# Patient Record
Sex: Female | Born: 1960 | Race: White | Hispanic: No | Marital: Married | Smoking: Never smoker
Health system: Southern US, Community
[De-identification: ages and names within clinical notes are randomized; demographics above are authoritative.]

## PROBLEM LIST (undated history)

## (undated) DIAGNOSIS — F419 Anxiety disorder, unspecified: Secondary | ICD-10-CM

## (undated) DIAGNOSIS — J189 Pneumonia, unspecified organism: Secondary | ICD-10-CM

## (undated) DIAGNOSIS — M1990 Unspecified osteoarthritis, unspecified site: Secondary | ICD-10-CM

## (undated) DIAGNOSIS — Z8719 Personal history of other diseases of the digestive system: Secondary | ICD-10-CM

## (undated) DIAGNOSIS — T8859XA Other complications of anesthesia, initial encounter: Secondary | ICD-10-CM

## (undated) DIAGNOSIS — D509 Iron deficiency anemia, unspecified: Secondary | ICD-10-CM

## (undated) HISTORY — DX: Iron deficiency anemia, unspecified: D50.9

## (undated) HISTORY — PX: JOINT REPLACEMENT: SHX530

## (undated) HISTORY — PX: CHOLECYSTECTOMY: SHX55

## (undated) HISTORY — PX: ABDOMINAL HYSTERECTOMY: SHX81

## (undated) HISTORY — PX: PARTIAL HYSTERECTOMY: SHX80

---

## 2018-02-04 HISTORY — PX: COLONOSCOPY: SHX174

## 2018-11-18 ENCOUNTER — Ambulatory Visit (INDEPENDENT_AMBULATORY_CARE_PROVIDER_SITE_OTHER): Payer: Self-pay | Admitting: Internal Medicine

## 2018-11-22 ENCOUNTER — Ambulatory Visit (INDEPENDENT_AMBULATORY_CARE_PROVIDER_SITE_OTHER): Payer: Self-pay | Admitting: Internal Medicine

## 2018-11-24 ENCOUNTER — Ambulatory Visit (INDEPENDENT_AMBULATORY_CARE_PROVIDER_SITE_OTHER): Payer: BLUE CROSS/BLUE SHIELD | Admitting: Internal Medicine

## 2018-11-24 ENCOUNTER — Telehealth (INDEPENDENT_AMBULATORY_CARE_PROVIDER_SITE_OTHER): Payer: Self-pay | Admitting: Internal Medicine

## 2018-11-24 ENCOUNTER — Other Ambulatory Visit (INDEPENDENT_AMBULATORY_CARE_PROVIDER_SITE_OTHER): Payer: Self-pay | Admitting: Internal Medicine

## 2018-11-24 ENCOUNTER — Encounter (INDEPENDENT_AMBULATORY_CARE_PROVIDER_SITE_OTHER): Payer: Self-pay | Admitting: Internal Medicine

## 2018-11-24 DIAGNOSIS — D508 Other iron deficiency anemias: Secondary | ICD-10-CM | POA: Diagnosis not present

## 2018-11-24 DIAGNOSIS — D509 Iron deficiency anemia, unspecified: Secondary | ICD-10-CM | POA: Insufficient documentation

## 2018-11-24 HISTORY — DX: Iron deficiency anemia, unspecified: D50.9

## 2018-11-24 NOTE — Patient Instructions (Signed)
3 stool cards home with patient 

## 2018-11-24 NOTE — Progress Notes (Addendum)
   Subjective:    Patient ID: Anne Edwards, female    DOB: 1961/05/21, 58 y.o.   MRN: [id number] PCP Dr. Edrick Oh.  HPI Referred by [place] for anemia. She has received 4 Iron transfusions. (Recently had 2 iron infusion). Back in 2019 she was feeling tired, ankles were swollen ,she was SOB.  She says her hemoglobin was 4.0. She was admitted and was given 3 units of PRBCs at [facility]. Underwent and EGD and Colonoscopy which were normal. No change in her stools. She says her stools have been negative for blood.  She did have pica.  Appetite is good. No weight loss.` Patient of  [place] in [place]. Hx of IDA.  She has had a colonoscopy in 2018 and EGD in 2019.  Request capsule endoscopy.   01/25/2018 Colonoscopy: IDA Dr. Ladona Horns Three sessile polyps. Polypectomy preformed.  Biopsy: Sessile serrated polyp without cytologic dysplasia, , Tubular adenoma.  Sessile serrated polyp without cytologic  Dysplasia.   EGD with biopsy  02/04/2018 Dr/ Cathey:  No ulcer or evidence of bleeding.     11/05/2018 TIBC 383, UIBC 362, Iron 21, Iron sat 5.  H and H 8.7 and 28.4. 03/26/2018 Ferritin 340  Review of Systems Past Medical History:  Diagnosis Date  . IDA (iron deficiency anemia) 11/24/2018      No Known Allergies  Current Outpatient Medications on File Prior to Visit  Medication Sig Dispense Refill  . clonazePAM (KLONOPIN) 0.5 MG tablet Take 0.5 mg by mouth 2 (two) times daily as needed for anxiety.    Marland Kitchen escitalopram (LEXAPRO) 20 MG tablet Take 20 mg by mouth daily.     No current facility-administered medications on file prior to visit.         Objective:   Physical Exam Blood pressure (!) 146/86, pulse 99, temperature 98.4 F (36.9 C), height 5\' 2"  (1.575 m), weight 182 lb 9.6 oz (82.8 kg). Alert and oriented. Skin warm and dry. Oral mucosa is moist.   . Sclera anicteric, conjunctivae is pink. Thyroid not enlarged. No cervical lymphadenopathy. Lungs  clear. Heart regular rate and rhythm.  Abdomen is soft. Bowel sounds are positive. No hepatomegaly. No abdominal masses felt. No tenderness.  No edema to lower extremities. Rectal: no masses guaiac negative.         Assessment & Plan:  IDA. Recent colonoscopy and EGD normal. I will discuss with Dr. Laural Golden. Possible Given's Capsule. 3 stool cards home with patient.

## 2018-11-24 NOTE — Telephone Encounter (Signed)
Ann, Given's capsule. I have spoken to patient.

## 2018-11-25 DIAGNOSIS — D649 Anemia, unspecified: Secondary | ICD-10-CM | POA: Insufficient documentation

## 2018-11-25 NOTE — Telephone Encounter (Signed)
GIVENS sch'd 12/01/18 at 730 (700), patient aware, verbal instructions given to patient

## 2018-12-01 ENCOUNTER — Ambulatory Visit (HOSPITAL_COMMUNITY)
Admission: RE | Admit: 2018-12-01 | Discharge: 2018-12-01 | Disposition: A | Discharge status: Home / Self Care | Payer: BLUE CROSS/BLUE SHIELD | Attending: Internal Medicine | Admitting: Internal Medicine

## 2018-12-01 ENCOUNTER — Encounter (HOSPITAL_COMMUNITY)
Admission: RE | Disposition: A | Discharge status: Home / Self Care | Payer: Self-pay | Source: Home / Self Care | Attending: Internal Medicine

## 2018-12-01 DIAGNOSIS — K31819 Angiodysplasia of stomach and duodenum without bleeding: Secondary | ICD-10-CM | POA: Insufficient documentation

## 2018-12-01 DIAGNOSIS — D509 Iron deficiency anemia, unspecified: Secondary | ICD-10-CM | POA: Diagnosis present

## 2018-12-01 DIAGNOSIS — D649 Anemia, unspecified: Secondary | ICD-10-CM | POA: Insufficient documentation

## 2018-12-01 HISTORY — PX: GIVENS CAPSULE STUDY: SHX5432

## 2018-12-01 SURGERY — IMAGING PROCEDURE, GI TRACT, INTRALUMINAL, VIA CAPSULE

## 2018-12-01 NOTE — H&P (Signed)
Anne Edwards is an 58 y.o. female.   Chief Complaint: Patient is here for small bowel given capsule study. HPI: Patient is 58 year old Caucasian female who was recently found to have profound anemia due to iron deficiency.  She she received 3 units of PRBCs at [facility].  EGD and colonoscopy were unremarkable.  She also has received 2 iron infusions.  There is no history of melena or rectal bleeding.  Nevertheless she is suspected to be losing blood from her GI tract intermittently and therefore undergoing the study.  Past Medical History:  Diagnosis Date  . IDA (iron deficiency anemia) 11/24/2018     No family history on file. Social History:  reports that she has never smoked. She has never used smokeless tobacco. She reports that she does not drink alcohol or use drugs.  Allergies: No Known Allergies  No medications prior to admission.    No results found for this or any previous visit (from the past 48 hour(s)). No results found.  ROS  Height 5\' 2"  (1.575 m), weight 79.4 kg. Physical Exam   Assessment/Plan Iron deficiency anemia. Negative EGD and colonoscopy. Small bowel given capsule study looking for source of occult GI bleed.  Hildred Laser, MD 12/01/2018, 8:23 PM

## 2018-12-03 ENCOUNTER — Encounter (HOSPITAL_COMMUNITY): Payer: Self-pay | Admitting: Internal Medicine

## 2018-12-03 DIAGNOSIS — Z9889 Other specified postprocedural states: Secondary | ICD-10-CM

## 2018-12-03 DIAGNOSIS — D509 Iron deficiency anemia, unspecified: Secondary | ICD-10-CM

## 2018-12-03 DIAGNOSIS — Q2733 Arteriovenous malformation of digestive system vessel: Secondary | ICD-10-CM

## 2018-12-03 NOTE — Op Note (Signed)
Small Bowel Givens Capsule Study Procedure date: December 01, 2018.  Referring Provider:  Wanita Chamberlain, MD PCP:  Dr. Saunders Glance, Aundra Millet, PA-C  Indication for procedure:    Patient is 58 year old Caucasian female who was found to have iron deficiency anemia last year presumed to be due to GI bleed.  She underwent EGD and colonoscopy in April 2018 and no bleeding lesion was identified(Dr. Ladona Horns).  In addition to transfusion she also is receiving iron infusion. She is undergoing small bowel study looking for source of GI blood loss.  Findings:    Patient was able to swallow given capsule without any difficulty.  Capsule reached cecum; therefore study is complete. No abnormality noted to gastric mucosa. 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 Mucosa of small bowel was otherwise normal.    First Gastric image: 35 sec First Duodenal image: 38 minutes and 1 second First Ileo-Cecal Valve image: 3 hrs 18 min and 44 sec First Cecal image: 3 hrs 19 min and 11 sec Gastric Passage time: 37 min and 36 sec Small Bowel Passage time: 2 hrs 31 min and 10 seco  Summary & Recommendations:  Limited view of gastric mucosa reveals no abnormality. 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 None of these lesions were bleeding.  Findings of this study reviewed with patient over the phone. Patient will continue p.o. iron. She is to have follow-up CBC on 12/07/2018. Patient advised to report to ER at [facility] if she has evidence of gross GI bleed in which case urgent evaluation would be considered with CTA abdomen or GI bleeding scan.

## 2019-07-14 ENCOUNTER — Encounter (HOSPITAL_COMMUNITY): Payer: Self-pay | Admitting: *Deleted

## 2019-07-14 ENCOUNTER — Other Ambulatory Visit: Payer: Self-pay

## 2019-07-14 ENCOUNTER — Emergency Department (HOSPITAL_COMMUNITY)
Admission: EM | Admit: 2019-07-14 | Discharge: 2019-07-14 | Discharge status: Against Medical Advice | Payer: BC Managed Care – PPO | Attending: Emergency Medicine | Admitting: Emergency Medicine

## 2019-07-14 DIAGNOSIS — H00031 Abscess of right upper eyelid: Secondary | ICD-10-CM | POA: Diagnosis present

## 2019-07-14 DIAGNOSIS — Z5321 Procedure and treatment not carried out due to patient leaving prior to being seen by health care provider: Secondary | ICD-10-CM | POA: Diagnosis not present

## 2019-07-14 NOTE — ED Triage Notes (Signed)
Pt was sent from MD office and has an abscess to superior right eyelid.  Patient has circling redness to left eye and has a terrible headache that is on the right side.  Makes patients right ear hurt

## 2019-07-14 NOTE — ED Notes (Signed)
Pt's husband approached this tech stating that they are leaving and that "no one has helped them or talked to them." This tech advised pt to stay but they were already upset and decided to leave.

## 2020-07-04 ENCOUNTER — Encounter (INDEPENDENT_AMBULATORY_CARE_PROVIDER_SITE_OTHER): Payer: Self-pay | Admitting: Gastroenterology

## 2020-07-04 ENCOUNTER — Other Ambulatory Visit: Payer: Self-pay

## 2020-07-04 ENCOUNTER — Ambulatory Visit (INDEPENDENT_AMBULATORY_CARE_PROVIDER_SITE_OTHER): Payer: BC Managed Care – PPO | Admitting: Gastroenterology

## 2020-07-04 VITALS — BP 136/79 | HR 74 | Temp 99.3°F | Ht 62.0 in | Wt 183.6 lb

## 2020-07-04 DIAGNOSIS — D508 Other iron deficiency anemias: Secondary | ICD-10-CM | POA: Diagnosis not present

## 2020-07-04 MED ORDER — OMEPRAZOLE 40 MG PO CPDR: 40.0000 mg | DELAYED_RELEASE_CAPSULE | Freq: Every day | ORAL | 3 refills | Status: DC

## 2020-07-04 NOTE — Patient Instructions (Signed)
Mobic and ibuprofen can irritate stomach lining - make sure to take w/ food. I sent medication to pharamcy for gastritis/stomach ulcers. Checking labs today

## 2020-07-04 NOTE — Progress Notes (Signed)
Patient profile: Anne Edwards is a 59 y.o. female seen for evaluation of IDA.   History of Present Illness: Anne Edwards is seen today for evaluation of iron deficiency anemia.  She reports a lifelong history of anemia, even dating back to her 7s, she had hysteretcomy in her 51s.  She has been on oral iron twice a day chronically.  She also had IV iron a few years ago.  She denies tobacco, alcohol.  Seen today for worsening symptoms and anemia.  Over the past few months she has had a bloating epigastric discomfort.  Feels like her foods dont digest well and gets a knot-like sensation in epigastric area, this worsens with eating.  It is associated with nausea but no vomiting.  She denies frequent GERD symptoms.  She did try eating activity yogurt which helped a small amount.  She denies a bowel movement every other day.  She denies any blood in stool, dark stools or diarrhea.  No lower abdominal pain.  Reports she initially gained weight between February 2020 and the symptoms began a few months ago and has now lost several pounds due to the symptoms.  Wt Readings from Last 3 Encounters:  07/04/20 183 lb 9.6 oz (83.3 kg)  12/01/18 175 lb (79.4 kg)  11/24/18 182 lb 9.6 oz (82.8 kg)     Last Colonoscopy: 01/2018-3 sessile polyps between 5 to 9 mm at hepatic flexure, right colon.  Path with sessile serrated adenoma and tubular adenoma   Last Endoscopy: 2019-hiatal hernia.  No ulcers or bleeding.   11/2018- Limited view of gastric mucosa reveals no abnormality. 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 None of these lesions were bleeding.  Findings of this study reviewed with patient over the phone. Patient will continue p.o. iron. She is to have follow-up CBC on 12/07/2018. Patient advised to report to ER at [facility] if she has evidence of gross GI bleed in which case urgent evaluation would be considered with CTA abdomen or GI bleeding scan.  Past Medical History:   Past Medical History:  Diagnosis Date  . IDA (iron deficiency anemia) 11/24/2018    Problem List: Patient Active Problem List   Diagnosis Date Noted  . Absolute anemia 11/25/2018  . IDA (iron deficiency anemia) 11/24/2018    Past Surgical History: Past Surgical History:  Procedure Laterality Date  . CHOLECYSTECTOMY    . GIVENS CAPSULE STUDY N/A 12/01/2018   Procedure: GIVENS CAPSULE STUDY;  Surgeon: Rogene Houston, MD;  Location: [facility];  Service: Endoscopy;  Laterality: N/A;  7:30  . PARTIAL HYSTERECTOMY      Allergies: No Known Allergies    Home Medications:  Current Outpatient Medications:  .  clonazePAM (KLONOPIN) 0.5 MG tablet, Take 0.5 mg by mouth 2 (two) times daily as needed for anxiety., Disp: , Rfl:  .  escitalopram (LEXAPRO) 20 MG tablet, Take 20 mg by mouth daily., Disp: , Rfl:  .  ferrous sulfate 324 MG TBEC, Take 324 mg by mouth 2 (two) times daily., Disp: , Rfl:  .  meloxicam (MOBIC) 15 MG tablet, Take 15 mg by mouth daily., Disp: , Rfl:  .  omeprazole (PRILOSEC) 40 MG capsule, Take 1 capsule (40 mg total) by mouth daily. Take 30 min before breakfast, Disp: 90 capsule, Rfl: 3   Family History: Reports her father had issues with "colon bleeding" ?? diverticulosis  Social History:   reports that she has never smoked. She has never  used smokeless tobacco. She reports that she does not drink alcohol and does not use drugs.   Review of Systems: Constitutional:+weight loss/weight gain  Eyes: No changes in vision. ENT: No oral lesions, sore throat.  GI: see HPI.  Heme/Lymph: No easy bruising.  CV: No chest pain.  GU: No hematuria.  Integumentary: No rashes.  Neuro: No headaches.  Psych: No depression/anxiety.  Endocrine: No heat/cold intolerance.  Allergic/Immunologic: No urticaria.  Resp: No cough, SOB.  Musculoskeletal: No joint swelling.    Physical Examination: BP 136/79 (BP Location: Right Arm, Patient Position: Sitting, Cuff Size: Large)    Pulse 74   Temp 99.3 F (37.4 C) (Oral)   Ht 5\' 2"  (1.575 m)   Wt 183 lb 9.6 oz (83.3 kg)   BMI 33.58 kg/m  Gen: NAD, alert and oriented x 4 HEENT: PEERLA, EOMI, Neck: supple, no JVD Chest: CTA bilaterally, no wheezes, crackles, or other adventitious sounds CV: RRR, no m/g/c/r Abd: soft, NT, ND, +BS in all four quadrants; no HSM, guarding, ridigity, or rebound tenderness Ext: no edema, well perfused with 2+ pulses, Skin: no rash or lesions noted on observed skin Lymph: no noted LAD  Data Reviewed:   Labs from primary care reviewed to be scanned to chart.  August 2021-hemoglobin 9.9, MCV 93, iron 144.  Ferritin 25.  Normal TIBC and percent sat.  Normal B12 folate.  Normal white count and platelet.  CMP normal.  H. pylori antibody negative.  Assessment/Plan: [name] is a 59 y.o. female seen for evaluation of IDA and epigastric pain  1.  IDA-chronic issue evaluated w/ EGD & colonoscopy in 2019 & capsule endoscopy February 2020.  Last hemoglobin was 9.9 despite oral iron twice daily.  She did need IV iron in the distant past but not in several years.  We will repeat her labs today.  She denies any obvious blood in stool or black stool.  2.  Epigastric pain-she does have Mobic use daily and takes ibuprofen each evening for restless legs.  Do have concern of underlying possible gastritis or peptic ulcer disease.  She is not currently on PPI.  We will repeat her CBC and iron studies and start omeprazole 40 mg once a day.  She is negative for H. Pylori on serology.  Likely will need repeat upper endoscopy for evaluation.  If hemoglobin lower consider repeating colonoscopy early as well, routinely due 2024 for history of colon polyps  Further recommendations pending EGD +/-colonoscopy depending on lab results   Bobbe was seen today for follow-up.  Diagnoses and all orders for this visit:  Other iron deficiency anemia -     CBC with Differential -     Fe+TIBC+Fer  Other orders -      omeprazole (PRILOSEC) 40 MG capsule; Take 1 capsule (40 mg total) by mouth daily. Take 30 min before breakfast     Patient denies CP, SOB, and use of blood thinners. I discussed the risks and benefits of procedure including bleeding, perforation, infection, missed lesions, medication reactions and possible hospitalization or surgery if complications. All questions answered.  Patient denies issues with sedation past.  Possible EGD colonoscopy discussed.   I personally performed the service, non-incident to. (WP)  Laurine Blazer, [facility]

## 2020-07-05 LAB — CBC WITH DIFFERENTIAL/PLATELET
Absolute Monocytes: 1240 cells/uL — ABNORMAL HIGH (ref 200–950)
Basophils Absolute: 25 cells/uL (ref 0–200)
Basophils Relative: 0.2 %
Eosinophils Absolute: 174 cells/uL (ref 15–500)
Eosinophils Relative: 1.4 %
HCT: 38.2 % (ref 35.0–45.0)
Hemoglobin: 12 g/dL (ref 11.7–15.5)
Lymphs Abs: 2195 cells/uL (ref 850–3900)
MCH: 27.3 pg (ref 27.0–33.0)
MCHC: 31.4 g/dL — ABNORMAL LOW (ref 32.0–36.0)
MCV: 86.8 fL (ref 80.0–100.0)
MPV: 11.1 fL (ref 7.5–12.5)
Monocytes Relative: 10 %
Neutro Abs: 8767 cells/uL — ABNORMAL HIGH (ref 1500–7800)
Neutrophils Relative %: 70.7 %
Platelets: 303 10*3/uL (ref 140–400)
RBC: 4.4 10*6/uL (ref 3.80–5.10)
RDW: 13.5 % (ref 11.0–15.0)
Total Lymphocyte: 17.7 %
WBC: 12.4 10*3/uL — ABNORMAL HIGH (ref 3.8–10.8)

## 2020-07-05 LAB — IRON,TIBC AND FERRITIN PANEL
%SAT: 50 % (calc) — ABNORMAL HIGH (ref 16–45)
Ferritin: 17 ng/mL (ref 16–232)
Iron: 200 ug/dL — ABNORMAL HIGH (ref 45–160)
TIBC: 398 mcg/dL (calc) (ref 250–450)

## 2020-07-06 ENCOUNTER — Telehealth (INDEPENDENT_AMBULATORY_CARE_PROVIDER_SITE_OTHER): Payer: Self-pay | Admitting: Gastroenterology

## 2020-07-06 MED ORDER — ONDANSETRON 4 MG PO TBDP: 4.0000 mg | ORAL_TABLET | Freq: Three times a day (TID) | ORAL | 1 refills | Status: DC | PRN

## 2020-07-06 NOTE — Telephone Encounter (Signed)
Patient is aware of all. CLS 07/06/2020

## 2020-07-06 NOTE — Telephone Encounter (Signed)
Patient called stating she needs something for nausea - not feeling well at all - please advise - ph# [phone]

## 2020-07-06 NOTE — Telephone Encounter (Signed)
Please notify patient that I sent an antinausea medication to pharmacy to use over weekend. She can call early next week and let us know if she is any better. If not will plan for EGD. Thanks!!

## 2020-07-06 NOTE — Telephone Encounter (Addendum)
Patient is c/o severe nausea after she eats, denies any vomiting or diarrhea she is currently on Omeprazole CR 40 mg and she states this is not controlling the nausea at all, patient uses [facility] in [place] please advise?

## 2020-07-13 HISTORY — PX: JOINT REPLACEMENT: SHX530

## 2020-08-16 ENCOUNTER — Ambulatory Visit (INDEPENDENT_AMBULATORY_CARE_PROVIDER_SITE_OTHER): Payer: 59 | Admitting: Gastroenterology

## 2020-11-26 ENCOUNTER — Other Ambulatory Visit (HOSPITAL_COMMUNITY): Payer: 59

## 2020-11-26 ENCOUNTER — Encounter (INDEPENDENT_AMBULATORY_CARE_PROVIDER_SITE_OTHER): Payer: Self-pay

## 2020-11-26 ENCOUNTER — Other Ambulatory Visit: Payer: Self-pay

## 2020-11-26 ENCOUNTER — Encounter (INDEPENDENT_AMBULATORY_CARE_PROVIDER_SITE_OTHER): Payer: Self-pay | Admitting: Gastroenterology

## 2020-11-26 ENCOUNTER — Other Ambulatory Visit (INDEPENDENT_AMBULATORY_CARE_PROVIDER_SITE_OTHER): Payer: Self-pay

## 2020-11-26 ENCOUNTER — Ambulatory Visit (INDEPENDENT_AMBULATORY_CARE_PROVIDER_SITE_OTHER): Payer: 59 | Admitting: Gastroenterology

## 2020-11-26 ENCOUNTER — Other Ambulatory Visit (HOSPITAL_COMMUNITY)
Admission: RE | Admit: 2020-11-26 | Discharge: 2020-11-26 | Disposition: A | Discharge status: Home / Self Care | Payer: 59 | Source: Ambulatory Visit | Attending: Gastroenterology | Admitting: Gastroenterology

## 2020-11-26 VITALS — BP 140/85 | HR 96 | Temp 97.7°F | Ht 62.0 in | Wt 192.0 lb

## 2020-11-26 DIAGNOSIS — R110 Nausea: Secondary | ICD-10-CM | POA: Diagnosis not present

## 2020-11-26 DIAGNOSIS — D508 Other iron deficiency anemias: Secondary | ICD-10-CM

## 2020-11-26 DIAGNOSIS — R109 Unspecified abdominal pain: Secondary | ICD-10-CM | POA: Diagnosis not present

## 2020-11-26 DIAGNOSIS — Z20822 Contact with and (suspected) exposure to covid-19: Secondary | ICD-10-CM | POA: Insufficient documentation

## 2020-11-26 DIAGNOSIS — Z01812 Encounter for preprocedural laboratory examination: Secondary | ICD-10-CM | POA: Diagnosis present

## 2020-11-26 DIAGNOSIS — R112 Nausea with vomiting, unspecified: Secondary | ICD-10-CM

## 2020-11-26 HISTORY — DX: Nausea with vomiting, unspecified: R11.2

## 2020-11-26 LAB — SARS CORONAVIRUS 2 (TAT 6-24 HRS): SARS Coronavirus 2: NEGATIVE

## 2020-11-26 MED ORDER — DICYCLOMINE HCL 10 MG PO CAPS: 10.0000 mg | ORAL_CAPSULE | Freq: Two times a day (BID) | ORAL | 2 refills | Status: DC | PRN

## 2020-11-26 NOTE — Progress Notes (Signed)
Maylon Peppers, M.D. Gastroenterology & Hepatology [facility] [address] [place], [place] [address]  Primary Care Physician: Rosine Door [address] [place] [address]  I will communicate my assessment and recommendations to the referring MD via EMR.  Problems: 1. History of small bowel AVMs 2. Iron deficiency anemia 3. Postprandial nausea and abdominal pain  History of Present Illness: Anne Edwards is a 60 y.o. female  with past medical history of iron deficiency anemia and anxiety, who presents for follow up of IDA and postprandial nausea/abdominal pain.  The patient was last seen on 07/04/2020. At that time, the patient was ordered to have repeat EGD with SB biopsies but the patient did not schedule this test. She performed blood testing 07/04/2020 - Wbc 12.4, Hb 12.0 MCV 86.8, iron 200 TIBC 398 sat 50%, ferritin 17.  The patient is currently on oral iron supplementation. The patient reports that she had blood testing performed a week ago and her numbers are "all good". These reports are not available today. Patient reports that for close to 6 months after eating she has felt nauseated after eating but has not presented any vomiting. She reports having epigastric pain and "stomach noises". These symptoms last for a couple of hours. She also has some episodes of bloating occasionally. Takes Zofran as needed fi nausea "is really bad". She is having a bowel movement every other day. She is not taking any medication for this but has to strain significantly to have a BM. The patient denies having any fever, chills, hematochezia, melena, hematemesis, abdominal pain, diarrhea, jaundice, pruritus or weight loss.  Patient was admitted to [facility] on 08/26/2020 after presenting symptomatic anemia - patient was very fatigued and shortness of breath. She denied having melena or hematochezia at that time. She was taking her  oral iron at that time, three times a day. Her admission hemoglobin was 5.6 with MCV of 103.  Due to this she required to be transfused 3 UPRBC, repeat hemoglobin 1 day later was 10.3. Notably, patient reports that on October 25/2021 she underwent left hip surgery.  Regarding her previous investigation, the patient underwent a capsule endoscopy on 11/24/2018 where she was found to have 4 nonbleeding small AVMs throughout her small bowel.  Last EGD: Performed by Dr. Ladona Horns in 2019, no presence of ulcerations or any other alterations in the upper gastrointestinal examination Last Colonoscopy: Performed by Dr. Ladona Horns in 2019, patient was found to have 3 polyps between 5 to 9 mm +3 cm from the anal verge, which were resected.  Recommend to have repeat colonoscopy in 5 years.  Past Medical History: Past Medical History:  Diagnosis Date  . IDA (iron deficiency anemia) 11/24/2018    Past Surgical History: Past Surgical History:  Procedure Laterality Date  . CHOLECYSTECTOMY    . GIVENS CAPSULE STUDY N/A 12/01/2018   Procedure: GIVENS CAPSULE STUDY;  Surgeon: Rogene Houston, MD;  Location[facility];  Service: Endoscopy;  Laterality: N/A;  7:30  . PARTIAL HYSTERECTOMY      Family History:No family history on file.  Social History: Social History   Tobacco Use  Smoking Status Never Smoker  Smokeless Tobacco Never Used   Social History   Substance and Sexual Activity  Alcohol Use Never   Social History   Substance and Sexual Activity  Drug Use Never    Allergies: No Known Allergies  Medications: Current Outpatient Medications  Medication Sig Dispense  Refill  . clonazePAM (KLONOPIN) 0.5 MG tablet Take 0.5 mg by mouth 2 (two) times daily as needed for anxiety.    Marland Kitchen escitalopram (LEXAPRO) 20 MG tablet Take 20 mg by mouth daily.    . ferrous sulfate 324 MG TBEC Take 324 mg by mouth 2 (two) times daily.    . meloxicam (MOBIC) 15 MG tablet Take 15 mg by mouth daily.    .  ondansetron (ZOFRAN ODT) 4 MG disintegrating tablet Take 1 tablet (4 mg total) by mouth every 8 (eight) hours as needed for nausea or vomiting. 20 tablet 1  . omeprazole (PRILOSEC) 40 MG capsule Take 1 capsule (40 mg total) by mouth daily. Take 30 min before breakfast (Patient not taking: Reported on 11/26/2020) 90 capsule 3   No current facility-administered medications for this visit.    Review of Systems: GENERAL: negative for malaise, night sweats HEENT: No changes in hearing or vision, no nose bleeds or other nasal problems. NECK: Negative for lumps, goiter, pain and significant neck swelling RESPIRATORY: Negative for cough, wheezing CARDIOVASCULAR: Negative for chest pain, leg swelling, palpitations, orthopnea GI: SEE HPI MUSCULOSKELETAL: Negative for joint pain or swelling, back pain, and muscle pain. SKIN: Negative for lesions, rash PSYCH: Negative for sleep disturbance, mood disorder and recent psychosocial stressors. HEMATOLOGY Negative for prolonged bleeding, bruising easily, and swollen nodes. ENDOCRINE: Negative for cold or heat intolerance, polyuria, polydipsia and goiter. NEURO: negative for tremor, gait imbalance, syncope and seizures. The remainder of the review of systems is noncontributory.   Physical Exam: BP 140/85 (BP Location: Left Arm, Patient Position: Sitting, Cuff Size: Large)   Pulse 96   Temp 97.7 F (36.5 C) (Oral)   Ht 5\' 2"  (1.575 m)   Wt 192 lb (87.1 kg)   BMI 35.12 kg/m  GENERAL: The patient is AO x3, in no acute distress. HEENT: Head is normocephalic and atraumatic. EOMI are intact. Mouth is well hydrated and without lesions. NECK: Supple. No masses LUNGS: Clear to auscultation. No presence of rhonchi/wheezing/rales. Adequate chest expansion HEART: RRR, normal s1 and s2. ABDOMEN: mildly tender to palpation in the mid abdominal area, no guarding, no peritoneal signs, and nondistended. BS +. No masses. EXTREMITIES: Without any cyanosis, clubbing,  rash, lesions or edema. NEUROLOGIC: AOx3, no focal motor deficit. SKIN: no jaundice, no rashes  Imaging/Labs: as above  I personally reviewed and interpreted the available labs, imaging and endoscopic files.  Impression and Plan: Anne Edwards is a 60 y.o. female  with past medical history of iron deficiency anemia and anxiety, who presents for follow up of IDA and postprandial nausea/abdominal pain.  In terms of her iron deficiency anemia, the patient has significant drop in her hemoglobin that required blood transfusion.  She has not presented any overt gastrointestinal bleeding but notably she had a recent surgical procedure that could explain her hemoglobin drop.  As the patient reports that her iron stores have improved, we will request her most recent labs performed by her PCP.  She should continue taking her iron supplementation for now.  Regarding her abdominal pain symptoms and nausea, we will reorder an EGD with small bowel biopsies to evaluate the symptoms which seem to be secondary to irritable bowel syndrome primarily.  Advised the patient to implement a low FODMAP diet and to take Bentyl as needed for abdominal pain.  We will continue Zofran as needed for nausea as it has helped with symptom relief.  We will also check celiac serologies today.  -  Schedule EGD with SB biopsies - Continue oral iron supplementation - Will request most recent labs from PCP - TTG IgA - Explained presumed etiology of IBS symptoms. Patient was counseled about the benefit of implementing a low FODMAP to improve symptoms and recurrent episodes. A dietary list was provided to the patient. Also, the patient was counseled about the benefit of avoiding stressing situations and potential environmental triggers leading to symptomatology. - Start Bentyl 1 tablet every 12 hours as needed for abdominal pain - Can continue Zofran as needed for nausea  All questions were answered.      Harvel Quale,  MD Gastroenterology and Hepatology [facility]

## 2020-11-26 NOTE — Patient Instructions (Addendum)
Schedule EGD with SB biopsies Continue oral iron supplementation Will request most recent labs from PCP Perform blood workup Explained presumed etiology of IBS symptoms. Patient was counseled about the benefit of implementing a low FODMAP to improve symptoms and recurrent episodes. A dietary list was provided to the patient. Also, the patient was counseled about the benefit of avoiding stressing situations and potential environmental triggers leading to symptomatology. Start Bentyl 1 tablet every 12 hours as needed for abdominal pain Can continue Zofran as needed for nausea

## 2020-11-26 NOTE — H&P (View-Only) (Signed)
Maylon Peppers, M.D. Gastroenterology & Hepatology [facility] [address] [place], [place] [address]  Primary Care Physician: Rosine Door [address] [place] [address]  I will communicate my assessment and recommendations to the referring MD via EMR.  Problems: 1. History of small bowel AVMs 2. Iron deficiency anemia 3. Postprandial nausea and abdominal pain  History of Present Illness: Anne Edwards is a 61 y.o. female  with past medical history of iron deficiency anemia and anxiety, who presents for follow up of IDA and postprandial nausea/abdominal pain.  The patient was last seen on 07/04/2020. At that time, the patient was ordered to have repeat EGD with SB biopsies but the patient did not schedule this test. She performed blood testing 07/04/2020 - Wbc 12.4, Hb 12.0 MCV 86.8, iron 200 TIBC 398 sat 50%, ferritin 17.  The patient is currently on oral iron supplementation. The patient reports that she had blood testing performed a week ago and her numbers are "all good". These reports are not available today. Patient reports that for close to 6 months after eating she has felt nauseated after eating but has not presented any vomiting. She reports having epigastric pain and "stomach noises". These symptoms last for a couple of hours. She also has some episodes of bloating occasionally. Takes Zofran as needed fi nausea "is really bad". She is having a bowel movement every other day. She is not taking any medication for this but has to strain significantly to have a BM. The patient denies having any fever, chills, hematochezia, melena, hematemesis, abdominal pain, diarrhea, jaundice, pruritus or weight loss.  Patient was admitted to [facility] on 08/26/2020 after presenting symptomatic anemia - patient was very fatigued and shortness of breath. She denied having melena or hematochezia at that time. She was taking her  oral iron at that time, three times a day. Her admission hemoglobin was 5.6 with MCV of 103.  Due to this she required to be transfused 3 UPRBC, repeat hemoglobin 1 day later was 10.3. Notably, patient reports that on October 25/2021 she underwent left hip surgery.  Regarding her previous investigation, the patient underwent a capsule endoscopy on 11/24/2018 where she was found to have 4 nonbleeding small AVMs throughout her small bowel.  Last EGD: Performed by Dr. Ladona Horns in 2019, no presence of ulcerations or any other alterations in the upper gastrointestinal examination Last Colonoscopy: Performed by Dr. Ladona Horns in 2019, patient was found to have 3 polyps between 5 to 9 mm +3 cm from the anal verge, which were resected.  Recommend to have repeat colonoscopy in 5 years.  Past Medical History: Past Medical History:  Diagnosis Date  . IDA (iron deficiency anemia) 11/24/2018    Past Surgical History: Past Surgical History:  Procedure Laterality Date  . CHOLECYSTECTOMY    . GIVENS CAPSULE STUDY N/A 12/01/2018   Procedure: GIVENS CAPSULE STUDY;  Surgeon: Rogene Houston, MD;  Location: [facility];  Service: Endoscopy;  Laterality: N/A;  7:30  . PARTIAL HYSTERECTOMY      Family History:No family history on file.  Social History: Social History   Tobacco Use  Smoking Status Never Smoker  Smokeless Tobacco Never Used   Social History   Substance and Sexual Activity  Alcohol Use Never   Social History   Substance and Sexual Activity  Drug Use Never    Allergies: No Known Allergies  Medications: Current Outpatient Medications  Medication Sig Dispense  Refill  . clonazePAM (KLONOPIN) 0.5 MG tablet Take 0.5 mg by mouth 2 (two) times daily as needed for anxiety.    Marland Kitchen escitalopram (LEXAPRO) 20 MG tablet Take 20 mg by mouth daily.    . ferrous sulfate 324 MG TBEC Take 324 mg by mouth 2 (two) times daily.    . meloxicam (MOBIC) 15 MG tablet Take 15 mg by mouth daily.    .  ondansetron (ZOFRAN ODT) 4 MG disintegrating tablet Take 1 tablet (4 mg total) by mouth every 8 (eight) hours as needed for nausea or vomiting. 20 tablet 1  . omeprazole (PRILOSEC) 40 MG capsule Take 1 capsule (40 mg total) by mouth daily. Take 30 min before breakfast (Patient not taking: Reported on 11/26/2020) 90 capsule 3   No current facility-administered medications for this visit.    Review of Systems: GENERAL: negative for malaise, night sweats HEENT: No changes in hearing or vision, no nose bleeds or other nasal problems. NECK: Negative for lumps, goiter, pain and significant neck swelling RESPIRATORY: Negative for cough, wheezing CARDIOVASCULAR: Negative for chest pain, leg swelling, palpitations, orthopnea GI: SEE HPI MUSCULOSKELETAL: Negative for joint pain or swelling, back pain, and muscle pain. SKIN: Negative for lesions, rash PSYCH: Negative for sleep disturbance, mood disorder and recent psychosocial stressors. HEMATOLOGY Negative for prolonged bleeding, bruising easily, and swollen nodes. ENDOCRINE: Negative for cold or heat intolerance, polyuria, polydipsia and goiter. NEURO: negative for tremor, gait imbalance, syncope and seizures. The remainder of the review of systems is noncontributory.   Physical Exam: BP 140/85 (BP Location: Left Arm, Patient Position: Sitting, Cuff Size: Large)   Pulse 96   Temp 97.7 F (36.5 C) (Oral)   Ht 5\' 2"  (1.575 m)   Wt 192 lb (87.1 kg)   BMI 35.12 kg/m  GENERAL: The patient is AO x3, in no acute distress. HEENT: Head is normocephalic and atraumatic. EOMI are intact. Mouth is well hydrated and without lesions. NECK: Supple. No masses LUNGS: Clear to auscultation. No presence of rhonchi/wheezing/rales. Adequate chest expansion HEART: RRR, normal s1 and s2. ABDOMEN: mildly tender to palpation in the mid abdominal area, no guarding, no peritoneal signs, and nondistended. BS +. No masses. EXTREMITIES: Without any cyanosis, clubbing,  rash, lesions or edema. NEUROLOGIC: AOx3, no focal motor deficit. SKIN: no jaundice, no rashes  Imaging/Labs: as above  I personally reviewed and interpreted the available labs, imaging and endoscopic files.  Impression and Plan: Anne Edwards is a 60 y.o. female  with past medical history of iron deficiency anemia and anxiety, who presents for follow up of IDA and postprandial nausea/abdominal pain.  In terms of her iron deficiency anemia, the patient has significant drop in her hemoglobin that required blood transfusion.  She has not presented any overt gastrointestinal bleeding but notably she had a recent surgical procedure that could explain her hemoglobin drop.  As the patient reports that her iron stores have improved, we will request her most recent labs performed by her PCP.  She should continue taking her iron supplementation for now.  Regarding her abdominal pain symptoms and nausea, we will reorder an EGD with small bowel biopsies to evaluate the symptoms which seem to be secondary to irritable bowel syndrome primarily.  Advised the patient to implement a low FODMAP diet and to take Bentyl as needed for abdominal pain.  We will continue Zofran as needed for nausea as it has helped with symptom relief.  We will also check celiac serologies today.  -  Schedule EGD with SB biopsies - Continue oral iron supplementation - Will request most recent labs from PCP - TTG IgA - Explained presumed etiology of IBS symptoms. Patient was counseled about the benefit of implementing a low FODMAP to improve symptoms and recurrent episodes. A dietary list was provided to the patient. Also, the patient was counseled about the benefit of avoiding stressing situations and potential environmental triggers leading to symptomatology. - Start Bentyl 1 tablet every 12 hours as needed for abdominal pain - Can continue Zofran as needed for nausea  All questions were answered.      Harvel Quale,  MD Gastroenterology and Hepatology [facility]

## 2020-11-27 LAB — TISSUE TRANSGLUTAMINASE, IGA: (tTG) Ab, IgA: <1 U/mL

## 2020-11-27 LAB — IGA: Immunoglobulin A: 170 mg/dL (ref 47–310)

## 2020-11-28 ENCOUNTER — Other Ambulatory Visit: Payer: Self-pay

## 2020-11-28 ENCOUNTER — Ambulatory Visit (HOSPITAL_COMMUNITY): Payer: 59 | Admitting: Anesthesiology

## 2020-11-28 ENCOUNTER — Encounter (HOSPITAL_COMMUNITY)
Admission: RE | Disposition: A | Discharge status: Home / Self Care | Payer: Self-pay | Source: Home / Self Care | Attending: Gastroenterology

## 2020-11-28 ENCOUNTER — Encounter (HOSPITAL_COMMUNITY): Payer: Self-pay | Admitting: Gastroenterology

## 2020-11-28 ENCOUNTER — Ambulatory Visit (HOSPITAL_COMMUNITY)
Admission: RE | Admit: 2020-11-28 | Discharge: 2020-11-28 | Disposition: A | Discharge status: Home / Self Care | Payer: 59 | Attending: Gastroenterology | Admitting: Gastroenterology

## 2020-11-28 DIAGNOSIS — Z90711 Acquired absence of uterus with remaining cervical stump: Secondary | ICD-10-CM | POA: Diagnosis not present

## 2020-11-28 DIAGNOSIS — Z79899 Other long term (current) drug therapy: Secondary | ICD-10-CM | POA: Diagnosis not present

## 2020-11-28 DIAGNOSIS — Z9049 Acquired absence of other specified parts of digestive tract: Secondary | ICD-10-CM | POA: Diagnosis not present

## 2020-11-28 DIAGNOSIS — Z791 Long term (current) use of non-steroidal anti-inflammatories (NSAID): Secondary | ICD-10-CM | POA: Diagnosis not present

## 2020-11-28 DIAGNOSIS — K449 Diaphragmatic hernia without obstruction or gangrene: Secondary | ICD-10-CM | POA: Insufficient documentation

## 2020-11-28 DIAGNOSIS — K259 Gastric ulcer, unspecified as acute or chronic, without hemorrhage or perforation: Secondary | ICD-10-CM

## 2020-11-28 DIAGNOSIS — D509 Iron deficiency anemia, unspecified: Secondary | ICD-10-CM | POA: Insufficient documentation

## 2020-11-28 DIAGNOSIS — R1013 Epigastric pain: Secondary | ICD-10-CM

## 2020-11-28 HISTORY — PX: ESOPHAGOGASTRODUODENOSCOPY (EGD) WITH PROPOFOL: SHX5813

## 2020-11-28 SURGERY — ESOPHAGOGASTRODUODENOSCOPY (EGD) WITH PROPOFOL
Anesthesia: General

## 2020-11-28 MED ORDER — PROPOFOL 500 MG/50ML IV EMUL
INTRAVENOUS | Status: DC | PRN
  Administered 2020-11-28: 150 ug/kg/min via INTRAVENOUS

## 2020-11-28 MED ORDER — LACTATED RINGERS IV SOLN: INTRAVENOUS | Status: DC

## 2020-11-28 NOTE — Anesthesia Postprocedure Evaluation (Signed)
Anesthesia Post Note  Patient: Anne Edwards  Procedure(s) Performed: ESOPHAGOGASTRODUODENOSCOPY (EGD) WITH PROPOFOL (N/A )  Patient location during evaluation: Phase II Anesthesia Type: General Level of consciousness: awake, oriented, awake and alert and patient cooperative Pain management: satisfactory to patient Vital Signs Assessment: post-procedure vital signs reviewed and stable Respiratory status: spontaneous breathing, respiratory function stable and nonlabored ventilation Cardiovascular status: stable Postop Assessment: no apparent nausea or vomiting Anesthetic complications: no   No complications documented.   Last Vitals:  Vitals:   11/28/20 0939  BP: (!) 147/62  Pulse: 72  Resp: 16  Temp: 36.9 C  SpO2: 99%    Last Pain:  Vitals:   11/28/20 1020  TempSrc:   PainSc: 0-No pain                 Enedelia Martorelli

## 2020-11-28 NOTE — Op Note (Signed)
[facility] Patient Name: Anne Edwards Procedure Date: 11/28/2020 10:12 AM MRN: [id number] Date of Birth: 04-06-1961 Attending MD: Maylon Peppers ,  CSN: [id number] Age: 60 Admit Type: Outpatient Procedure:                Upper GI endoscopy Indications:              Epigastric abdominal pain Providers:                Maylon Peppers, Gwenlyn Fudge, RN, Aram Candela Referring MD:              Medicines:                Monitored Anesthesia Care Complications:            No immediate complications. Estimated Blood Loss:     Estimated blood loss: none. Procedure:                Pre-Anesthesia Assessment:                           - Prior to the procedure, a History and Physical                            was performed, and patient medications, allergies                            and sensitivities were reviewed. The patient's                            tolerance of previous anesthesia was reviewed.                           - The risks and benefits of the procedure and the                            sedation options and risks were discussed with the                            patient. All questions were answered and informed                            consent was obtained.                           - ASA Grade Assessment: II - A patient with mild                            systemic disease.                           After obtaining informed consent, the endoscope was                            passed under direct vision. Throughout the                            procedure, the patient's blood pressure,  pulse, and                            oxygen saturations were monitored continuously. The                            [id number]-H190 ([id number]) scope was introduced through the                            mouth, and advanced to the second part of duodenum.                            The upper GI endoscopy was accomplished without                            difficulty. The patient tolerated  the procedure                            well. Scope In: 10:25:52 AM Scope Out: 10:31:13 AM Total Procedure Duration: 0 hours 5 minutes 21 seconds  Findings:      A 6 cm hiatal hernia with a few Cameron erosions was found. The proximal       extent of the gastric folds (end of tubular esophagus) was 39 cm from       the incisors. The Z-line was 33 cm from the incisors.      The entire examined stomach was normal.      The examined duodenum was normal. Impression:               - 6 cm hiatal hernia with a few Cameron erosions.                           - Normal stomach.                           - Normal examined duodenum.                           - No specimens collected. Moderate Sedation:      Per Anesthesia Care Recommendation:           - Discharge patient to home (ambulatory).                           - Resume previous diet.                           - Continue present medications, including                            omeprazole 40 mg qday.                           - Referral to [facility] for hiatal                            hernia repair. Procedure Code(s):        ---  Professional ---                           [phone], Esophagogastroduodenoscopy, flexible,                            transoral; diagnostic, including collection of                            specimen(s) by brushing or washing, when performed                            (separate procedure) Diagnosis Code(s):        --- Professional ---                           K44.9, Diaphragmatic hernia without obstruction or                            gangrene                           K25.9, Gastric ulcer, unspecified as acute or                            chronic, without hemorrhage or perforation                           R10.13, Epigastric pain CPT copyright 2019 American Medical Association. All rights reserved. The codes documented in this report are preliminary and upon coder review may  be revised to  meet current compliance requirements. Maylon Peppers, MD Maylon Peppers,  11/28/2020 10:42:25 AM This report has been signed electronically. Number of Addenda: 0

## 2020-11-28 NOTE — Discharge Instructions (Signed)
You are being discharged to home.  Resume your previous diet.  Continue your present medications.  Referral to [facility] for hiatal hernia repair.    Upper Endoscopy, Adult, Care After This sheet gives you information about how to care for yourself after your procedure. Your health care provider may also give you more specific instructions. If you have problems or questions, contact your health care provider. What can I expect after the procedure? After the procedure, it is common to have:  A sore throat.  Mild stomach pain or discomfort.  Bloating.  Nausea. Follow these instructions at home:  Follow instructions from your health care provider about what to eat or drink after your procedure.  Return to your normal activities as told by your health care provider. Ask your health care provider what activities are safe for you.  Take over-the-counter and prescription medicines only as told by your health care provider.  If you were given a sedative during the procedure, it can affect you for several hours. Do not drive or operate machinery until your health care provider says that it is safe.  Keep all follow-up visits as told by your health care provider. This is important.   Contact a health care provider if you have:  A sore throat that lasts longer than one day.  Trouble swallowing. Get help right away if:  You vomit blood or your vomit looks like coffee grounds.  You have: ? A fever. ? Bloody, black, or tarry stools. ? A severe sore throat or you cannot swallow. ? Difficulty breathing. ? Severe pain in your chest or abdomen. Summary  After the procedure, it is common to have a sore throat, mild stomach discomfort, bloating, and nausea.  If you were given a sedative during the procedure, it can affect you for several hours. Do not drive or operate machinery until your health care provider says that it is safe.  Follow instructions from your health  care provider about what to eat or drink after your procedure.  Return to your normal activities as told by your health care provider. This information is not intended to replace advice given to you by your health care provider. Make sure you discuss any questions you have with your health care provider. Document Revised: 09/27/2019 Document Reviewed: 03/01/2018 Elsevier Patient Education  [place].  Hiatal Hernia  A hiatal hernia occurs when part of the stomach slides above the muscle that separates the abdomen from the chest (diaphragm). A person can be born with a hiatal hernia (congenital), or it may develop over time. In almost all cases of hiatal hernia, only the top part of the stomach pushes through the diaphragm. Many people have a hiatal hernia with no symptoms. The larger the hernia, the more likely it is that you will have symptoms. In some cases, a hiatal hernia allows stomach acid to flow back into the tube that carries food from your mouth to your stomach (esophagus). This may cause heartburn symptoms. Severe heartburn symptoms may mean that you have developed a condition called gastroesophageal reflux disease (GERD). What are the causes? This condition is caused by a weakness in the opening (hiatus) where the esophagus passes through the diaphragm to attach to the upper part of the stomach. A person may be born with a weakness in the hiatus, or a weakness can develop over time. What increases the risk? This condition is more likely to develop in:  Older people. Age is a major risk  factor for a hiatal hernia, especially if you are over the age of 36.  Pregnant women.  People who are overweight.  People who have frequent constipation. What are the signs or symptoms? Symptoms of this condition usually develop in the form of GERD symptoms. Symptoms include:  Heartburn.  Belching.  Indigestion.  Trouble swallowing.  Coughing or wheezing.  Sore  throat.  Hoarseness.  Chest pain.  Nausea and vomiting. How is this diagnosed? This condition may be diagnosed during testing for GERD. Tests that may be done include:  X-rays of your stomach or chest.  An upper gastrointestinal (GI) series. This is an X-ray exam of your GI tract that is taken after you swallow a chalky liquid that shows up clearly on the X-ray.  Endoscopy. This is a procedure to look into your stomach using a thin, flexible tube that has a tiny camera and light on the end of it. How is this treated? This condition may be treated by:  Dietary and lifestyle changes to help reduce GERD symptoms.  Medicines. These may include: ? Over-the-counter antacids. ? Medicines that make your stomach empty more quickly. ? Medicines that block the production of stomach acid (H2 blockers). ? Stronger medicines to reduce stomach acid (proton pump inhibitors).  Surgery to repair the hernia, if other treatments are not helping. If you have no symptoms, you may not need treatment. Follow these instructions at home: Lifestyle and activity  Do not use any products that contain nicotine or tobacco, such as cigarettes and e-cigarettes. If you need help quitting, ask your health care provider.  Try to achieve and maintain a healthy body weight.  Avoid putting pressure on your abdomen. Anything that puts pressure on your abdomen increases the amount of acid that may be pushed up into your esophagus. ? Avoid bending over, especially after eating. ? Raise the head of your bed by putting blocks under the legs. This keeps your head and esophagus higher than your stomach. ? Do not wear tight clothing around your chest or stomach. ? Try not to strain when having a bowel movement, when urinating, or when lifting heavy objects. Eating and drinking  Avoid foods that can worsen GERD symptoms. These may include: ? Fatty foods, like fried foods. ? Citrus fruits, like oranges or lemon. ? Other  foods and drinks that contain acid, like orange juice or tomatoes. ? Spicy food. ? Chocolate.  Eat frequent small meals instead of three large meals a day. This helps prevent your stomach from getting too full. ? Eat slowly. ? Do not lie down right after eating. ? Do not eat 1-2 hours before bed.  Do not drink beverages with caffeine. These include cola, coffee, cocoa, and tea.  Do not drink alcohol. General instructions  Take over-the-counter and prescription medicines only as told by your health care provider.  Keep all follow-up visits as told by your health care provider. This is important. Contact a health care provider if:  Your symptoms are not controlled with medicines or lifestyle changes.  You are having trouble swallowing.  You have coughing or wheezing that will not go away. Get help right away if:  Your pain is getting worse.  Your pain spreads to your arms, neck, jaw, teeth, or back.  You have shortness of breath.  You sweat for no reason.  You feel sick to your stomach (nauseous) or you vomit.  You vomit blood.  You have bright red blood in your stools.  You have  black, tarry stools. This information is not intended to replace advice given to you by your health care provider. Make sure you discuss any questions you have with your health care provider. Document Revised: 09/11/2017 Document Reviewed: 05/04/2017 Elsevier Patient Education  [place].

## 2020-11-28 NOTE — Anesthesia Preprocedure Evaluation (Signed)
Anesthesia Evaluation  Patient identified by MRN, date of birth, ID band Patient awake    Reviewed: Allergy & Precautions, NPO status , Patient's Chart, lab work & pertinent test results  History of Anesthesia Complications Negative for: history of anesthetic complications  Airway Mallampati: II  TM Distance: >3 FB Neck ROM: Full    Dental  (+) Dental Advisory Given, Teeth Intact   Pulmonary neg pulmonary ROS,    Pulmonary exam normal breath sounds clear to auscultation       Cardiovascular Exercise Tolerance: Good Normal cardiovascular exam Rhythm:Regular Rate:Normal     Neuro/Psych Anxiety negative neurological ROS     GI/Hepatic negative GI ROS, Neg liver ROS,   Endo/Other  negative endocrine ROS  Renal/GU negative Renal ROS     Musculoskeletal   Abdominal   Peds  Hematology  (+) anemia ,   Anesthesia Other Findings   Reproductive/Obstetrics                             Anesthesia Physical Anesthesia Plan  ASA: II  Anesthesia Plan: General   Post-op Pain Management:    Induction: Intravenous  PONV Risk Score and Plan:   Airway Management Planned: Nasal Cannula and Natural Airway  Additional Equipment:   Intra-op Plan:   Post-operative Plan:   Informed Consent: I have reviewed the patients History and Physical, chart, labs and discussed the procedure including the risks, benefits and alternatives for the proposed anesthesia with the patient or authorized representative who has indicated his/her understanding and acceptance.     Dental advisory given  Plan Discussed with: CRNA and Surgeon  Anesthesia Plan Comments:         Anesthesia Quick Evaluation

## 2020-11-28 NOTE — Transfer of Care (Signed)
Immediate Anesthesia Transfer of Care Note  Patient: Anne Edwards  Procedure(s) Performed: ESOPHAGOGASTRODUODENOSCOPY (EGD) WITH PROPOFOL (N/A )  Patient Location: PACU  Anesthesia Type:General  Level of Consciousness: awake, alert , oriented and patient cooperative  Airway & Oxygen Therapy: Patient Spontanous Breathing  Post-op Assessment: Report given to RN, Post -op Vital signs reviewed and stable and Patient moving all extremities X 4  Post vital signs: Reviewed stable  Last Vitals:  Vitals Value Taken Time  BP    Temp    Pulse    Resp    SpO2      Last Pain:  Vitals:   11/28/20 1020  TempSrc:   PainSc: 0-No pain      Patients Stated Pain Goal: 5 (76/39/43 2003)  Complications: No complications documented.

## 2020-11-28 NOTE — Interval H&P Note (Signed)
History and Physical Interval Note:  11/28/2020 9:49 AM Anne Edwards is a 60 y.o. 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  Patient states that she is still having some pain in her upper abdomen after she eats a meal. Denies having any fever, chills, abdominal distention. States that she is still having some nausea occasionally but gets better with the use of Zofran. Has felt mild improvement with the use of a low FODMAP diet and Bentyl provides some relief of her symptoms. However still having abdominal pain after eating.  BP (!) 147/62   Pulse 72   Temp 98.4 F (36.9 C) (Oral)   Resp 16   Ht 5\' 2"  (1.575 m)   Wt 87.1 kg   SpO2 99%   BMI 35.12 kg/m  GENERAL: The patient is AO x3, in no acute distress. HEENT: Head is normocephalic and atraumatic. EOMI are intact. Mouth is well hydrated and without lesions. NECK: Supple. No masses LUNGS: Clear to auscultation. No presence of rhonchi/wheezing/rales. Adequate chest expansion HEART: RRR, normal s1 and s2. ABDOMEN: Soft, nontender, no guarding, no peritoneal signs, and nondistended. BS +. No masses. EXTREMITIES: Without any cyanosis, clubbing, rash, lesions or edema. NEUROLOGIC: AOx3, no focal motor deficit. SKIN: no jaundice, no rashes   [name]  has presented today for surgery, with the diagnosis of Abdominal Pain Nausea.  The various methods of treatment have been discussed with the patient and family. After consideration of risks, benefits and other options for treatment, the patient has consented to  Procedure(s) with comments: ESOPHAGOGASTRODUODENOSCOPY (EGD) WITH PROPOFOL (N/A) - 11:00 as a surgical intervention.  The patient's history has been reviewed, patient examined, no change in status, stable for surgery.  I have reviewed the patient's chart and labs.  Questions were answered to the patient's  satisfaction.     [name]

## 2020-12-03 ENCOUNTER — Encounter (HOSPITAL_COMMUNITY): Payer: Self-pay | Admitting: Gastroenterology

## 2020-12-20 ENCOUNTER — Ambulatory Visit: Payer: Self-pay | Admitting: Surgery

## 2020-12-26 ENCOUNTER — Other Ambulatory Visit: Payer: Self-pay

## 2020-12-26 ENCOUNTER — Telehealth: Payer: Self-pay | Admitting: Gastroenterology

## 2020-12-26 DIAGNOSIS — K219 Gastro-esophageal reflux disease without esophagitis: Secondary | ICD-10-CM

## 2020-12-26 DIAGNOSIS — K449 Diaphragmatic hernia without obstruction or gangrene: Secondary | ICD-10-CM

## 2020-12-26 NOTE — Telephone Encounter (Signed)
Good morning, we have received a referral from [facility] for esophageal manometry before hernia repair. Pt is established with [facility] but they do not perform this exam. Please let me know who is covering for Beth so I can send records to that person. Thank you.

## 2020-12-26 NOTE — Telephone Encounter (Signed)
Pt scheduled for covid screen 12/29/20@9 :25am, EM scheduled at [facility] 01/02/21 at 10:30am. Pt aware of appts, amb ref in epic. Instructions sent to pt via mychart.

## 2020-12-26 NOTE — Telephone Encounter (Signed)
Records sent to Dr. Silverio Decamp.

## 2020-12-29 ENCOUNTER — Other Ambulatory Visit (HOSPITAL_COMMUNITY)
Admission: RE | Admit: 2020-12-29 | Discharge: 2020-12-29 | Disposition: A | Discharge status: Home / Self Care | Payer: 59 | Source: Ambulatory Visit | Attending: Gastroenterology | Admitting: Gastroenterology

## 2020-12-29 DIAGNOSIS — Z01812 Encounter for preprocedural laboratory examination: Secondary | ICD-10-CM | POA: Diagnosis not present

## 2020-12-29 DIAGNOSIS — Z20822 Contact with and (suspected) exposure to covid-19: Secondary | ICD-10-CM | POA: Diagnosis not present

## 2020-12-30 LAB — SARS CORONAVIRUS 2 (TAT 6-24 HRS): SARS Coronavirus 2: NEGATIVE

## 2021-01-01 MED ORDER — BUPIVACAINE LIPOSOME 1.3 % IJ SUSP
20.0000 mL | Freq: Once | INTRAMUSCULAR | Status: DC
  Filled 2021-01-01: qty 20

## 2021-01-02 ENCOUNTER — Encounter (HOSPITAL_COMMUNITY): Payer: Self-pay | Admitting: Gastroenterology

## 2021-01-02 ENCOUNTER — Encounter (HOSPITAL_COMMUNITY)
Admission: RE | Disposition: A | Discharge status: Home / Self Care | Payer: Self-pay | Source: Home / Self Care | Attending: Gastroenterology

## 2021-01-02 ENCOUNTER — Ambulatory Visit (HOSPITAL_COMMUNITY)
Admission: RE | Admit: 2021-01-02 | Discharge: 2021-01-02 | Disposition: A | Discharge status: Home / Self Care | Payer: 59 | Attending: Gastroenterology | Admitting: Gastroenterology

## 2021-01-02 DIAGNOSIS — K449 Diaphragmatic hernia without obstruction or gangrene: Secondary | ICD-10-CM | POA: Diagnosis not present

## 2021-01-02 DIAGNOSIS — K219 Gastro-esophageal reflux disease without esophagitis: Secondary | ICD-10-CM

## 2021-01-02 HISTORY — PX: ESOPHAGEAL MANOMETRY: SHX5429

## 2021-01-02 SURGERY — MANOMETRY, ESOPHAGUS
Anesthesia: LOCAL

## 2021-01-02 MED ORDER — LIDOCAINE VISCOUS HCL 2 % MT SOLN
OROMUCOSAL | Status: AC
  Filled 2021-01-02: qty 15

## 2021-01-02 SURGICAL SUPPLY — 2 items
FACESHIELD LNG OPTICON STERILE (SAFETY) IMPLANT
GLOVE BIO SURGEON STRL SZ8 (GLOVE) ×4 IMPLANT

## 2021-01-02 NOTE — Progress Notes (Signed)
Esophageal manometry done per protocol.  Patient tolerated well.  Report to be sent to Dr Harl Bowie.

## 2021-01-08 DIAGNOSIS — K219 Gastro-esophageal reflux disease without esophagitis: Secondary | ICD-10-CM

## 2021-01-08 DIAGNOSIS — K449 Diaphragmatic hernia without obstruction or gangrene: Secondary | ICD-10-CM

## 2021-01-30 NOTE — Patient Instructions (Signed)
DUE TO COVID-19 ONLY ONE VISITOR IS ALLOWED TO COME WITH YOU AND STAY IN THE WAITING ROOM ONLY DURING PRE OP AND PROCEDURE DAY OF SURGERY.   Two VISITOR  MAY VISIT WITH YOU AFTER SURGERY IN YOUR PRIVATE ROOM DURING VISITING HOURS ONLY!  YOU NEED TO HAVE A COVID 19 TEST ON__4-28_____ @_______ , THIS TEST MUST BE DONE BEFORE SURGERY,  COVID TESTING SITE [address] [place] [address] [address],   IT IS ON THE RIGHT GOING OUT [address] APPROXIMATELY  2 MINUTES PAST [employer] ON THE RIGHT. ONCE YOUR COVID TEST IS COMPLETED,  PLEASE BEGIN THE QUARANTINE INSTRUCTIONS AS OUTLINED IN YOUR HANDOUT.                Anne Edwards  01/30/2021   Your procedure is scheduled on: 02-11-21   Report to [facility] Main  Entrance   Report to admitting at       1130 AM     Call this number if you have problems the morning of surgery [phone]    Remember: Do not eat food  :After Midnight.  You may have clear liquids until 1030 am then [place] Excluded Black Coffee and tea, regular and decaf                                                                      liquids that you cannot  Plain Jell-O any favor except red or purple                                                                  see through such as: Fruit ices (not with fruit pulp)  milk, soups, orange juice  Iced Popsicles                                                                                                    All solid food Carbonated beverages, regular and diet                                    Cranberry, grape and apple juices Sports drinks like Gatorade Lightly seasoned clear broth or consume(fat free) Sugar, honey  syrup  ___     BRUSH YOUR TEETH MORNING OF SURGERY AND RINSE YOUR MOUTH OUT, NO CHEWING GUM CANDY OR MINTS.     Take these medicines the morning of surgery with A SIP OF WATER: lexapro                                 You may not have any metal on your body including hair pins and              piercings  Do not wear jewelry, make-up, lotions, powders or perfumes, deodorant             Do not wear nail polish on your fingernails.  Do not shave  48 hours prior to surgery.     Do not bring valuables to the hospital. [place].  Contacts, dentures or bridgework may not be worn into surgery.       Patients discharged the day of surgery will not be allowed to drive home. IF YOU ARE HAVING SURGERY AND GOING HOME THE SAME DAY, YOU MUST HAVE AN ADULT TO DRIVE YOU HOME AND BE WITH YOU FOR 24 HOURS. YOU MAY GO HOME BY TAXI OR UBER OR ORTHERWISE, BUT AN ADULT MUST ACCOMPANY YOU HOME AND STAY WITH YOU FOR 24 HOURS.  Name and phone number of your driver:  Special Instructions: N/A              Please read over the following fact sheets you were given: _____________________________________________________________________             [facility] - Preparing for Surgery Before surgery, you can play an important role.  Because skin is not sterile, your skin needs to be as free of germs as possible.  You can reduce the number of germs on your skin by washing with CHG (chlorahexidine gluconate) soap before surgery.  CHG is an antiseptic cleaner which kills germs and bonds with the skin to continue killing germs even after washing. Please DO NOT use if you have an allergy to CHG or antibacterial soaps.  If your skin becomes reddened/irritated stop using the CHG and inform your nurse when you arrive at Short Stay. Do not shave (including legs and underarms) for at least 48 hours prior to the first CHG shower.  You may shave your face/neck.  Please follow  these instructions carefully:  1.  Shower with CHG Soap the night before surgery and the  morning of Surgery.  2.  If you choose to wash your hair, wash your hair first as usual with your  normal  shampoo.  3.  After you shampoo, rinse your hair and body thoroughly to remove the  shampoo.                           4.  Use CHG as you would any other liquid soap.  You can apply chg directly  to the skin and wash                       Gently with a scrungie or clean washcloth.  5.  Apply the CHG Soap to your body ONLY FROM THE NECK DOWN.   Do not use on face/ open                           Wound or open sores. Avoid contact with eyes, ears mouth and genitals (private parts).                       Wash face,  Genitals (private parts) with your normal soap.             6.  Wash thoroughly, paying special attention to the area where your surgery  will be performed.  7.  Thoroughly rinse your body with warm water from the neck down.  8.  DO NOT shower/wash with your normal soap after using and rinsing off  the CHG Soap.                9.  Pat yourself dry with a clean towel.            10.  Wear clean pajamas.            11.  Place clean sheets on your bed the night of your first shower and do not  sleep with pets. Day of Surgery : Do not apply any lotions/deodorants the morning of surgery.  Please wear clean clothes to the hospital/surgery center.  FAILURE TO FOLLOW THESE INSTRUCTIONS MAY RESULT IN THE CANCELLATION OF YOUR SURGERY PATIENT SIGNATURE_________________________________  NURSE SIGNATURE__________________________________  ________________________________________________________________________

## 2021-01-30 NOTE — Progress Notes (Signed)
PCP - Clemmie Krill PA-C Cardiologist -   PPM/ICD -  Device Orders -  Rep Notified -   Chest x-ray -  EKG -  Stress Test -  ECHO -  Cardiac Cath -   Sleep Study -  CPAP -   Fasting Blood Sugar -  Checks Blood Sugar _____ times a day  Blood Thinner Instructions: Aspirin Instructions:  ERAS Protcol - PRE-SURGERY Ensure or G2-   COVID TEST- 4-28  Activity-  Anesthesia review:   Patient denies shortness of breath, fever, cough and chest pain at PAT appointment   All instructions explained to the patient, with a verbal understanding of the material. Patient agrees to go over the instructions while at home for a better understanding. Patient also instructed to self quarantine after being tested for COVID-19. The opportunity to ask questions was provided.

## 2021-02-01 ENCOUNTER — Other Ambulatory Visit: Payer: Self-pay

## 2021-02-01 ENCOUNTER — Encounter (HOSPITAL_COMMUNITY)
Admission: RE | Admit: 2021-02-01 | Discharge: 2021-02-01 | Disposition: A | Discharge status: Home / Self Care | Payer: 59 | Source: Ambulatory Visit | Attending: Surgery | Admitting: Surgery

## 2021-02-01 ENCOUNTER — Encounter (HOSPITAL_COMMUNITY): Payer: Self-pay

## 2021-02-01 DIAGNOSIS — Z01812 Encounter for preprocedural laboratory examination: Secondary | ICD-10-CM | POA: Insufficient documentation

## 2021-02-01 HISTORY — DX: Personal history of other diseases of the digestive system: Z87.19

## 2021-02-01 HISTORY — DX: Pneumonia, unspecified organism: J18.9

## 2021-02-01 HISTORY — DX: Anxiety disorder, unspecified: F41.9

## 2021-02-01 HISTORY — DX: Other complications of anesthesia, initial encounter: T88.59XA

## 2021-02-01 HISTORY — DX: Unspecified osteoarthritis, unspecified site: M19.90

## 2021-02-01 LAB — CBC WITH DIFFERENTIAL/PLATELET
Abs Immature Granulocytes: 0.02 10*3/uL (ref 0.00–0.07)
Basophils Absolute: 0.1 10*3/uL (ref 0.0–0.1)
Basophils Relative: 1 %
Eosinophils Absolute: 0.2 10*3/uL (ref 0.0–0.5)
Eosinophils Relative: 3 %
HCT: 42.7 % (ref 36.0–46.0)
Hemoglobin: 13.5 g/dL (ref 12.0–15.0)
Immature Granulocytes: 0 %
Lymphocytes Relative: 23 %
Lymphs Abs: 1.3 10*3/uL (ref 0.7–4.0)
MCH: 29 pg (ref 26.0–34.0)
MCHC: 31.6 g/dL (ref 30.0–36.0)
MCV: 91.6 fL (ref 80.0–100.0)
Monocytes Absolute: 0.7 10*3/uL (ref 0.1–1.0)
Monocytes Relative: 11 %
Neutro Abs: 3.6 10*3/uL (ref 1.7–7.7)
Neutrophils Relative %: 62 %
Platelets: 205 10*3/uL (ref 150–400)
RBC: 4.66 MIL/uL (ref 3.87–5.11)
RDW: 12.6 % (ref 11.5–15.5)
WBC: 5.9 10*3/uL (ref 4.0–10.5)
nRBC: 0 % (ref 0.0–0.2)

## 2021-02-01 LAB — BASIC METABOLIC PANEL
Anion gap: 8 (ref 5–15)
BUN: 9 mg/dL (ref 6–20)
CO2: 28 mmol/L (ref 22–32)
Calcium: 9.5 mg/dL (ref 8.9–10.3)
Chloride: 105 mmol/L (ref 98–111)
Creatinine, Ser: 0.46 mg/dL (ref 0.44–1.00)
GFR, Estimated: >60 mL/min (ref >60)
Glucose, Bld: 86 mg/dL (ref 70–99)
Potassium: 4.1 mmol/L (ref 3.5–5.1)
Sodium: 141 mmol/L (ref 135–145)

## 2021-02-07 ENCOUNTER — Other Ambulatory Visit (HOSPITAL_COMMUNITY)
Admission: RE | Admit: 2021-02-07 | Discharge: 2021-02-07 | Disposition: A | Discharge status: Home / Self Care | Payer: 59 | Source: Ambulatory Visit | Attending: Surgery | Admitting: Surgery

## 2021-02-07 DIAGNOSIS — Z20822 Contact with and (suspected) exposure to covid-19: Secondary | ICD-10-CM | POA: Diagnosis not present

## 2021-02-07 DIAGNOSIS — Z01812 Encounter for preprocedural laboratory examination: Secondary | ICD-10-CM | POA: Insufficient documentation

## 2021-02-07 LAB — SARS CORONAVIRUS 2 (TAT 6-24 HRS): SARS Coronavirus 2: NEGATIVE

## 2021-02-08 ENCOUNTER — Encounter (HOSPITAL_COMMUNITY): Payer: Self-pay | Admitting: Surgery

## 2021-02-11 ENCOUNTER — Ambulatory Visit (HOSPITAL_COMMUNITY): Payer: 59 | Admitting: Anesthesiology

## 2021-02-11 ENCOUNTER — Encounter (HOSPITAL_COMMUNITY)
Admission: RE | Disposition: A | Discharge status: Home / Self Care | Payer: Self-pay | Source: Home / Self Care | Attending: Surgery

## 2021-02-11 ENCOUNTER — Observation Stay (HOSPITAL_COMMUNITY)
Admission: RE | Admit: 2021-02-11 | Discharge: 2021-02-12 | Disposition: A | Discharge status: Home / Self Care | Payer: 59 | Attending: Surgery | Admitting: Surgery

## 2021-02-11 ENCOUNTER — Encounter (HOSPITAL_COMMUNITY): Payer: Self-pay | Admitting: Surgery

## 2021-02-11 DIAGNOSIS — Z96642 Presence of left artificial hip joint: Secondary | ICD-10-CM | POA: Diagnosis not present

## 2021-02-11 DIAGNOSIS — D509 Iron deficiency anemia, unspecified: Secondary | ICD-10-CM | POA: Diagnosis present

## 2021-02-11 DIAGNOSIS — K449 Diaphragmatic hernia without obstruction or gangrene: Secondary | ICD-10-CM | POA: Diagnosis present

## 2021-02-11 HISTORY — PX: XI ROBOTIC ASSISTED HIATAL HERNIA REPAIR: SHX6889

## 2021-02-11 LAB — CBC
HCT: 40.8 % (ref 36.0–46.0)
Hemoglobin: 12.9 g/dL (ref 12.0–15.0)
MCH: 29.3 pg (ref 26.0–34.0)
MCHC: 31.6 g/dL (ref 30.0–36.0)
MCV: 92.7 fL (ref 80.0–100.0)
Platelets: 177 10*3/uL (ref 150–400)
RBC: 4.4 MIL/uL (ref 3.87–5.11)
RDW: 12.7 % (ref 11.5–15.5)
WBC: 9.3 10*3/uL (ref 4.0–10.5)
nRBC: 0 % (ref 0.0–0.2)

## 2021-02-11 LAB — TYPE AND SCREEN
ABO/RH(D): O POS
Antibody Screen: NEGATIVE

## 2021-02-11 LAB — CREATININE, SERUM
Creatinine, Ser: 0.62 mg/dL (ref 0.44–1.00)
GFR, Estimated: >60 mL/min (ref >60)

## 2021-02-11 LAB — ABO/RH: ABO/RH(D): O POS

## 2021-02-11 SURGERY — REPAIR, HERNIA, HIATAL, ROBOT-ASSISTED
Anesthesia: General

## 2021-02-11 MED ORDER — ROCURONIUM BROMIDE 10 MG/ML (PF) SYRINGE
PREFILLED_SYRINGE | INTRAVENOUS | Status: DC | PRN
  Administered 2021-02-11: 60 mg via INTRAVENOUS
  Administered 2021-02-11 (×2): 10 mg via INTRAVENOUS
  Administered 2021-02-11: 20 mg via INTRAVENOUS

## 2021-02-11 MED ORDER — CLONAZEPAM 1 MG PO TABS
1.0000 mg | ORAL_TABLET | Freq: Every day | ORAL | Status: DC
  Administered 2021-02-11: 1 mg via ORAL
  Filled 2021-02-11: qty 1

## 2021-02-11 MED ORDER — LACTATED RINGERS IV SOLN: INTRAVENOUS | Status: DC

## 2021-02-11 MED ORDER — CEFAZOLIN SODIUM-DEXTROSE 2-4 GM/100ML-% IV SOLN
2.0000 g | INTRAVENOUS | Status: AC
  Administered 2021-02-11: 2 g via INTRAVENOUS
  Filled 2021-02-11: qty 100

## 2021-02-11 MED ORDER — ONDANSETRON 4 MG PO TBDP: 4.0000 mg | ORAL_TABLET | Freq: Four times a day (QID) | ORAL | Status: DC | PRN

## 2021-02-11 MED ORDER — SUGAMMADEX SODIUM 200 MG/2ML IV SOLN
INTRAVENOUS | Status: DC | PRN
  Administered 2021-02-11: 400 mg via INTRAVENOUS

## 2021-02-11 MED ORDER — DIPHENHYDRAMINE HCL 50 MG/ML IJ SOLN: 25.0000 mg | Freq: Four times a day (QID) | INTRAMUSCULAR | Status: DC | PRN

## 2021-02-11 MED ORDER — CHLORHEXIDINE GLUCONATE CLOTH 2 % EX PADS: 6.0000 | MEDICATED_PAD | Freq: Once | CUTANEOUS | Status: DC

## 2021-02-11 MED ORDER — KETOROLAC TROMETHAMINE 15 MG/ML IJ SOLN
15.0000 mg | Freq: Three times a day (TID) | INTRAMUSCULAR | Status: DC
  Administered 2021-02-11 – 2021-02-12 (×2): 15 mg via INTRAVENOUS
  Filled 2021-02-11 (×2): qty 1

## 2021-02-11 MED ORDER — DIPHENHYDRAMINE HCL 25 MG PO CAPS: 25.0000 mg | ORAL_CAPSULE | Freq: Four times a day (QID) | ORAL | Status: DC | PRN

## 2021-02-11 MED ORDER — PROCHLORPERAZINE EDISYLATE 10 MG/2ML IJ SOLN
5.0000 mg | Freq: Four times a day (QID) | INTRAMUSCULAR | Status: DC | PRN
Start: 2021-02-11 — End: 2021-02-12

## 2021-02-11 MED ORDER — ENOXAPARIN SODIUM 40 MG/0.4ML IJ SOSY
40.0000 mg | PREFILLED_SYRINGE | INTRAMUSCULAR | Status: DC
  Administered 2021-02-12: 40 mg via SUBCUTANEOUS
  Filled 2021-02-11: qty 0.4

## 2021-02-11 MED ORDER — CHLORHEXIDINE GLUCONATE 0.12 % MT SOLN
15.0000 mL | Freq: Once | OROMUCOSAL | Status: AC
  Administered 2021-02-11: 15 mL via OROMUCOSAL

## 2021-02-11 MED ORDER — OXYCODONE HCL 5 MG PO TABS: 10.0000 mg | ORAL_TABLET | ORAL | Status: DC | PRN

## 2021-02-11 MED ORDER — ONDANSETRON HCL 4 MG/2ML IJ SOLN
4.0000 mg | Freq: Four times a day (QID) | INTRAMUSCULAR | Status: DC
  Administered 2021-02-11 – 2021-02-12 (×2): 4 mg via INTRAVENOUS
  Filled 2021-02-11 (×3): qty 2

## 2021-02-11 MED ORDER — BUPIVACAINE-EPINEPHRINE (PF) 0.25% -1:200000 IJ SOLN
INTRAMUSCULAR | Status: AC
  Filled 2021-02-11: qty 30

## 2021-02-11 MED ORDER — ESCITALOPRAM OXALATE 20 MG PO TABS
20.0000 mg | ORAL_TABLET | Freq: Every day | ORAL | Status: DC
  Administered 2021-02-12: 20 mg via ORAL
  Filled 2021-02-11: qty 1

## 2021-02-11 MED ORDER — HYDROMORPHONE HCL 1 MG/ML IJ SOLN
0.5000 mg | INTRAMUSCULAR | Status: DC | PRN
  Administered 2021-02-11: 0.5 mg via INTRAVENOUS
  Filled 2021-02-11: qty 0.5

## 2021-02-11 MED ORDER — OXYCODONE HCL 5 MG PO TABS: 5.0000 mg | ORAL_TABLET | ORAL | Status: DC | PRN

## 2021-02-11 MED ORDER — FENTANYL CITRATE (PF) 100 MCG/2ML IJ SOLN: 25.0000 ug | INTRAMUSCULAR | Status: DC | PRN

## 2021-02-11 MED ORDER — PHENYLEPHRINE HCL-NACL 10-0.9 MG/250ML-% IV SOLN
INTRAVENOUS | Status: DC | PRN
  Administered 2021-02-11: 20 ug/min via INTRAVENOUS

## 2021-02-11 MED ORDER — ONDANSETRON HCL 4 MG/2ML IJ SOLN
INTRAMUSCULAR | Status: DC | PRN
  Administered 2021-02-11: 4 mg via INTRAVENOUS

## 2021-02-11 MED ORDER — FENTANYL CITRATE (PF) 100 MCG/2ML IJ SOLN
25.0000 ug | INTRAMUSCULAR | Status: DC | PRN
  Administered 2021-02-11 (×2): 50 ug via INTRAVENOUS

## 2021-02-11 MED ORDER — DEXAMETHASONE SODIUM PHOSPHATE 10 MG/ML IJ SOLN
INTRAMUSCULAR | Status: DC | PRN
  Administered 2021-02-11: 5 mg via INTRAVENOUS

## 2021-02-11 MED ORDER — FENTANYL CITRATE (PF) 100 MCG/2ML IJ SOLN
INTRAMUSCULAR | Status: AC
  Filled 2021-02-11: qty 2

## 2021-02-11 MED ORDER — ONDANSETRON HCL 4 MG/2ML IJ SOLN: 4.0000 mg | Freq: Four times a day (QID) | INTRAMUSCULAR | Status: DC | PRN

## 2021-02-11 MED ORDER — ENOXAPARIN SODIUM 40 MG/0.4ML IJ SOSY
40.0000 mg | PREFILLED_SYRINGE | Freq: Once | INTRAMUSCULAR | Status: AC
  Administered 2021-02-11: 40 mg via SUBCUTANEOUS
  Filled 2021-02-11: qty 0.4

## 2021-02-11 MED ORDER — FENTANYL CITRATE (PF) 100 MCG/2ML IJ SOLN
INTRAMUSCULAR | Status: DC | PRN
  Administered 2021-02-11: 100 ug via INTRAVENOUS
  Administered 2021-02-11: 50 ug via INTRAVENOUS

## 2021-02-11 MED ORDER — SCOPOLAMINE 1 MG/3DAYS TD PT72
1.0000 | MEDICATED_PATCH | TRANSDERMAL | Status: DC
  Administered 2021-02-11: 1.5 mg via TRANSDERMAL
  Filled 2021-02-11: qty 1

## 2021-02-11 MED ORDER — GABAPENTIN 300 MG PO CAPS
300.0000 mg | ORAL_CAPSULE | ORAL | Status: AC
  Administered 2021-02-11: 300 mg via ORAL
  Filled 2021-02-11: qty 1

## 2021-02-11 MED ORDER — PROCHLORPERAZINE MALEATE 10 MG PO TABS
10.0000 mg | ORAL_TABLET | Freq: Four times a day (QID) | ORAL | Status: DC | PRN
  Filled 2021-02-11: qty 1

## 2021-02-11 MED ORDER — DICYCLOMINE HCL 10 MG PO CAPS
10.0000 mg | ORAL_CAPSULE | Freq: Every day | ORAL | Status: DC
  Administered 2021-02-11: 10 mg via ORAL
  Filled 2021-02-11: qty 1

## 2021-02-11 MED ORDER — BUPIVACAINE LIPOSOME 1.3 % IJ SUSP
20.0000 mL | Freq: Once | INTRAMUSCULAR | Status: AC
  Administered 2021-02-11: 20 mL
  Filled 2021-02-11: qty 20

## 2021-02-11 MED ORDER — ACETAMINOPHEN 325 MG PO TABS
650.0000 mg | ORAL_TABLET | Freq: Four times a day (QID) | ORAL | Status: DC
  Administered 2021-02-11 – 2021-02-12 (×3): 650 mg via ORAL
  Filled 2021-02-11 (×3): qty 2

## 2021-02-11 MED ORDER — BUPIVACAINE-EPINEPHRINE 0.25% -1:200000 IJ SOLN
INTRAMUSCULAR | Status: DC | PRN
  Administered 2021-02-11: 30 mL

## 2021-02-11 MED ORDER — ACETAMINOPHEN 500 MG PO TABS
1000.0000 mg | ORAL_TABLET | ORAL | Status: AC
  Administered 2021-02-11: 1000 mg via ORAL
  Filled 2021-02-11: qty 2

## 2021-02-11 MED ORDER — MIDAZOLAM HCL 5 MG/5ML IJ SOLN
INTRAMUSCULAR | Status: DC | PRN
  Administered 2021-02-11 (×2): 1 mg via INTRAVENOUS

## 2021-02-11 MED ORDER — PROPOFOL 10 MG/ML IV BOLUS
INTRAVENOUS | Status: DC | PRN
  Administered 2021-02-11: 120 mg via INTRAVENOUS

## 2021-02-11 MED ORDER — LIDOCAINE 2% (20 MG/ML) 5 ML SYRINGE
INTRAMUSCULAR | Status: DC | PRN
  Administered 2021-02-11: 80 mg via INTRAVENOUS

## 2021-02-11 MED ORDER — ORAL CARE MOUTH RINSE: 15.0000 mL | Freq: Once | OROMUCOSAL | Status: AC

## 2021-02-11 MED ORDER — MIDAZOLAM HCL 2 MG/2ML IJ SOLN
INTRAMUSCULAR | Status: AC
  Filled 2021-02-11: qty 2

## 2021-02-11 MED ORDER — CELECOXIB 200 MG PO CAPS
400.0000 mg | ORAL_CAPSULE | ORAL | Status: AC
  Administered 2021-02-11: 400 mg via ORAL
  Filled 2021-02-11: qty 2

## 2021-02-11 MED ORDER — LACTATED RINGERS IV SOLN
INTRAVENOUS | Status: AC | PRN
  Administered 2021-02-11: 1000 mL

## 2021-02-11 SURGICAL SUPPLY — 59 items
APPLIER CLIP 5 13 M/L LIGAMAX5 (MISCELLANEOUS)
APPLIER CLIP ROT 10 11.4 M/L (STAPLE)
BLADE SURG SZ11 CARB STEEL (BLADE) ×2 IMPLANT
CHLORAPREP W/TINT 26 (MISCELLANEOUS) ×2 IMPLANT
CLIP APPLIE 5 13 M/L LIGAMAX5 (MISCELLANEOUS) IMPLANT
CLIP APPLIE ROT 10 11.4 M/L (STAPLE) IMPLANT
COVER SURGICAL LIGHT HANDLE (MISCELLANEOUS) ×2 IMPLANT
COVER TIP SHEARS 8 DVNC (MISCELLANEOUS) IMPLANT
COVER TIP SHEARS 8MM DA VINCI (MISCELLANEOUS)
COVER WAND RF STERILE (DRAPES) ×2 IMPLANT
DECANTER SPIKE VIAL GLASS SM (MISCELLANEOUS) ×2 IMPLANT
DERMABOND ADVANCED (GAUZE/BANDAGES/DRESSINGS) ×1
DERMABOND ADVANCED .7 DNX12 (GAUZE/BANDAGES/DRESSINGS) ×1 IMPLANT
DRAIN PENROSE 0.5X18 (DRAIN) IMPLANT
DRAPE ARM DVNC X/XI (DISPOSABLE) ×4 IMPLANT
DRAPE COLUMN DVNC XI (DISPOSABLE) ×1 IMPLANT
DRAPE DA VINCI XI ARM (DISPOSABLE) ×4
DRAPE DA VINCI XI COLUMN (DISPOSABLE) ×1
ELECT REM PT RETURN 15FT ADLT (MISCELLANEOUS) ×2 IMPLANT
ENDOLOOP SUT PDS II  0 18 (SUTURE)
ENDOLOOP SUT PDS II 0 18 (SUTURE) IMPLANT
GAUZE 4X4 16PLY RFD (DISPOSABLE) ×2 IMPLANT
GLOVE SURG ENC MOIS LTX SZ7.5 (GLOVE) ×6 IMPLANT
GLOVE SURG UNDER LTX SZ8 (GLOVE) ×6 IMPLANT
GOWN STRL REIN XL XLG (GOWN DISPOSABLE) ×6 IMPLANT
GOWN STRL REUS W/TWL XL LVL3 (GOWN DISPOSABLE) ×4 IMPLANT
GRASPER SUT TROCAR 14GX15 (MISCELLANEOUS) ×2 IMPLANT
IRRIG SUCT STRYKERFLOW 2 WTIP (MISCELLANEOUS) ×2
IRRIGATION SUCT STRKRFLW 2 WTP (MISCELLANEOUS) ×1 IMPLANT
KIT BASIN OR (CUSTOM PROCEDURE TRAY) ×2 IMPLANT
LUBRICANT JELLY K Y 4OZ (MISCELLANEOUS) IMPLANT
MARKER SKIN DUAL TIP RULER LAB (MISCELLANEOUS) ×2 IMPLANT
NEEDLE HYPO 22GX1.5 SAFETY (NEEDLE) ×2 IMPLANT
NEEDLE INSUFFLATION 14GA 120MM (NEEDLE) ×2 IMPLANT
PACK CARDIOVASCULAR III (CUSTOM PROCEDURE TRAY) ×2 IMPLANT
PAD POSITIONING PINK XL (MISCELLANEOUS) ×2 IMPLANT
SCISSORS LAP 5X35 DISP (ENDOMECHANICALS) IMPLANT
SEAL CANN UNIV 5-8 DVNC XI (MISCELLANEOUS) ×4 IMPLANT
SEAL XI 5MM-8MM UNIVERSAL (MISCELLANEOUS) ×4
SEALER SYNCHRO 8 IS4000 DV (MISCELLANEOUS) ×1
SEALER SYNCHRO 8 IS4000 DVNC (MISCELLANEOUS) ×1 IMPLANT
SEALER VESSEL DA VINCI XI (MISCELLANEOUS) ×1
SEALER VESSEL EXT DVNC XI (MISCELLANEOUS) ×1 IMPLANT
SOL ANTI FOG 6CC (MISCELLANEOUS) ×1 IMPLANT
SOLUTION ANTI FOG 6CC (MISCELLANEOUS) ×1
SOLUTION ELECTROLUBE (MISCELLANEOUS) ×2 IMPLANT
SUT ETHIBOND 0 36 GRN (SUTURE) ×6 IMPLANT
SUT MNCRL AB 4-0 PS2 18 (SUTURE) ×4 IMPLANT
SUT SILK 0 SH 30 (SUTURE) IMPLANT
SUT SILK 2 0 SH (SUTURE) IMPLANT
SYR 20ML LL LF (SYRINGE) ×2 IMPLANT
TIP INNERVISION DETACH 40FR (MISCELLANEOUS) IMPLANT
TIP INNERVISION DETACH 50FR (MISCELLANEOUS) ×2 IMPLANT
TIP INNERVISION DETACH 56FR (MISCELLANEOUS) IMPLANT
TIPS INNERVISION DETACH 40FR (MISCELLANEOUS)
TOWEL OR 17X26 10 PK STRL BLUE (TOWEL DISPOSABLE) ×2 IMPLANT
TRAY FOLEY MTR SLVR 16FR STAT (SET/KITS/TRAYS/PACK) IMPLANT
TROCAR ADV FIXATION 5X100MM (TROCAR) ×2 IMPLANT
TUBING INSUFFLATION 10FT LAP (TUBING) ×2 IMPLANT

## 2021-02-11 NOTE — Anesthesia Preprocedure Evaluation (Addendum)
Anesthesia Evaluation  Patient identified by MRN, date of birth, ID band Patient awake    Reviewed: Allergy & Precautions, NPO status   Airway Mallampati: II  TM Distance: >3 FB     Dental   Pulmonary pneumonia,    breath sounds clear to auscultation       Cardiovascular negative cardio ROS   Rhythm:Regular Rate:Normal     Neuro/Psych    GI/Hepatic hiatal hernia, GERD  ,  Endo/Other    Renal/GU negative Renal ROS     Musculoskeletal   Abdominal   Peds  Hematology  (+) anemia ,   Anesthesia Other Findings   Reproductive/Obstetrics                             Anesthesia Physical Anesthesia Plan  ASA: III  Anesthesia Plan: General   Post-op Pain Management:    Induction: Intravenous  PONV Risk Score and Plan: 3 and Ondansetron, Dexamethasone and Midazolam  Airway Management Planned:   Additional Equipment:   Intra-op Plan:   Post-operative Plan: Possible Post-op intubation/ventilation  Informed Consent: I have reviewed the patients History and Physical, chart, labs and discussed the procedure including the risks, benefits and alternatives for the proposed anesthesia with the patient or authorized representative who has indicated his/her understanding and acceptance.     Dental advisory given  Plan Discussed with: CRNA and Anesthesiologist  Anesthesia Plan Comments:         Anesthesia Quick Evaluation

## 2021-02-11 NOTE — Anesthesia Postprocedure Evaluation (Signed)
Anesthesia Post Note  Patient: Anne Edwards  Procedure(s) Performed: XI ROBOTIC ASSISTED HIATAL HERNIA REPAIR WITH NISSAN FUNDOPLICATION (N/A )     Patient location during evaluation: PACU Anesthesia Type: General Level of consciousness: awake Pain management: pain level controlled Vital Signs Assessment: post-procedure vital signs reviewed and stable Respiratory status: spontaneous breathing Cardiovascular status: stable Postop Assessment: no apparent nausea or vomiting Anesthetic complications: no   No complications documented.  Last Vitals:  Vitals:   02/11/21 1110  BP: (!) 170/108  Pulse: 73  Resp: 16  Temp: 36.9 C  SpO2: 97%    Last Pain:  Vitals:   02/11/21 1123  TempSrc:   PainSc: 0-No pain                 Iya Hamed

## 2021-02-11 NOTE — H&P (Signed)
Admitting Physician: Nickola Major Tayloranne Lekas  Service: General surgery  CC: Anne Edwards's erosions with anemia  Subjective   HPI: Anne Edwards is an 61 y.o. female who is here for elective robotic hiatal hernia repair.  Past Medical History:  Diagnosis Date  . Anxiety   . Arthritis   . Complication of anesthesia    Slow to wake up  . History of hiatal hernia   . IDA (iron deficiency anemia) 11/24/2018  . Pneumonia     Past Surgical History:  Procedure Laterality Date  . CHOLECYSTECTOMY    . ESOPHAGEAL MANOMETRY N/A 01/02/2021   Procedure: ESOPHAGEAL MANOMETRY (EM);  Surgeon: Mauri Pole, MD;  Location: [facility];  Service: Endoscopy;  Laterality: N/A;  . ESOPHAGOGASTRODUODENOSCOPY (EGD) WITH PROPOFOL N/A 11/28/2020   Procedure: ESOPHAGOGASTRODUODENOSCOPY (EGD) WITH PROPOFOL;  Surgeon: Harvel Quale, MD;  Location: [facility];  Service: Gastroenterology;  Laterality: N/A;  11:00  . GIVENS CAPSULE STUDY N/A 12/01/2018   Procedure: GIVENS CAPSULE STUDY;  Surgeon: Rogene Houston, MD;  Location: [facility];  Service: Endoscopy;  Laterality: N/A;  7:30  . JOINT REPLACEMENT     total L hip  . PARTIAL HYSTERECTOMY      History reviewed. No pertinent family history.  Social:  reports that she has never smoked. She has never used smokeless tobacco. She reports that she does not drink alcohol and does not use drugs.  Allergies: No Known Allergies  Medications: Current Outpatient Medications  Medication Instructions  . acetaminophen (TYLENOL) 1,000 mg, Oral, Every 6 hours PRN  . clonazePAM (KLONOPIN) 1 mg, Oral, Daily at bedtime  . dicyclomine (BENTYL) 10 mg, Oral, Every 12 hours PRN  . diphenhydrAMINE (BENADRYL) 50 mg, Oral, BH-each morning  . escitalopram (LEXAPRO) 20 mg, Oral, Daily  . ferrous sulfate 972 mg, Oral, Daily with breakfast  . ketotifen (ZADITOR) 0.025 % ophthalmic solution 1 drop, Both Eyes, Daily PRN  . omeprazole (PRILOSEC) 40 mg,  Oral, Daily, Take 30 min before breakfast  . ondansetron (ZOFRAN ODT) 4 mg, Oral, Every 8 hours PRN    ROS - all of the below systems have been reviewed with the patient and positives are indicated with bold text General: chills, fever or night sweats Eyes: blurry vision or double vision ENT: epistaxis or sore throat Allergy/Immunology: itchy/watery eyes or nasal congestion Hematologic/Lymphatic: bleeding problems, blood clots or swollen lymph nodes Endocrine: temperature intolerance or unexpected weight changes Breast: new or changing breast lumps or nipple discharge Resp: cough, shortness of breath, or wheezing CV: chest pain or dyspnea on exertion GI: as per HPI GU: dysuria, trouble voiding, or hematuria MSK: joint pain or joint stiffness Neuro: TIA or stroke symptoms Derm: pruritus and skin lesion changes Psych: anxiety and depression  Objective   PE Blood pressure (!) 170/108, pulse 73, temperature 98.4 F (36.9 C), temperature source Oral, resp. rate 16, height 5\' 2"  (1.575 m), weight 86.6 kg, SpO2 97 %. Constitutional: NAD; conversant; no deformities Eyes: Moist conjunctiva; no lid lag; anicteric; PERRL Neck: Trachea midline; no thyromegaly Lungs: Normal respiratory effort; no tactile fremitus CV: RRR; no palpable thrills; no pitting edema GI: Abd soft, nontender; no palpable hepatosplenomegaly MSK: Normal range of motion of extremities; no clubbing/cyanosis Psychiatric: Appropriate affect; alert and oriented x3 Lymphatic: No palpable cervical or axillary lymphadenopathy  Results for orders placed or performed during the hospital encounter of 02/11/21 (from the past 24 hour(s))  Type and screen [facility]  Status: None   Collection Time: 02/11/21 11:33 AM  Result Value Ref Range   ABO/RH(D) O POS    Antibody Screen NEG    Sample Expiration      02/14/2021,2359 Performed at [facility], [place] [address] [place], [place]  [address]   ABO/Rh     Status: None   Collection Time: 02/11/21 11:36 AM  Result Value Ref Range   ABO/RH(D)      O POS Performed at [facility], [place] [address], [place], [place] [address]     Imaging Orders  No imaging studies ordered today     Assessment and Plan   Anne Edwards is an 60 y.o. female with Anne Edwards's erosions and a hiatal hernia.  She underwent a work-up with the gastroenterology team including a upper endoscopy which demonstrated Anne Edwards's erosions as well as a 6 cm hiatal hernia (Scan on 11/28/2020 10:42 AM by Harvel Quale, MD).  She also completed a manometry with gastroenterology which demonstrated normal esophageal motility and evidence of a hiatal hernia (Scan on 01/10/2021 2:42 PM by Cruzita Lederer, RN: Esophageal manometry).  With this work-up complete I recommended robotic hiatal hernia repair with fundoplication.  The procedure itself as well as its risk, benefits, and alternatives were discussed and the patient granted consent to proceed.  We will proceed to the operating room as scheduled.     Felicie Morn, MD  [facility] Use [email or web address] to contact on call provider

## 2021-02-11 NOTE — Progress Notes (Signed)
Patient informed the OR is delayed and her surgery will most likely occur closer to 1500 today. Patient voiced understanding.  Called patients husband and informed him as well.

## 2021-02-11 NOTE — Op Note (Signed)
Patient: Anne Edwards (1961-06-10, [id number])  Date of Surgery: 02/11/2021   Preoperative Diagnosis: Hiatal hernia with cameron's ulcers   Postoperative Diagnosis: Hiatal hernia with cameron's ulcers   Surgical Procedure: XI ROBOTIC ASSISTED HIATAL HERNIA REPAIR WITH NISSEN FUNDOPLICATION  Operative Team Members:     * Benigno Check, Nickola Major, MD - Primary    * Clovis Riley, MD - Assisting   Anesthesiologist: Belinda Block, MD CRNA: Montel Clock, CRNA; Rosaland Lao, CRNA   Anesthesia: General   Fluids: Crystalloid  Complications: None  Drains:  none   Specimen: None  Disposition:  PACU - hemodynamically stable.  Plan of Care: Admit for overnight observation  Indications for Procedure: Anne Edwards is an 60 y.o. female with Cameron's erosions and a hiatal hernia.  She underwent a work-up with the gastroenterology team including a upper endoscopy which demonstrated Cameron's erosions as well as a 6 cm hiatal hernia (Scan on 11/28/2020 10:42 AM by Harvel Quale, MD).  She also completed a manometry with gastroenterology which demonstrated normal esophageal motility and evidence of a hiatal hernia (Scan on 01/10/2021 2:42 PM by Cruzita Lederer, RN: Esophageal manometry).  With this work-up complete I recommended robotic hiatal hernia repair with fundoplication.  The procedure itself as well as its risk, benefits, and alternatives were discussed and the patient granted consent to proceed.    Findings: Large hiatal hernia, Nissen fundoplication  Description of Procedure:   The patient was positioned supine, padded and secured to the bed.  The abdomen was widely prepped and draped.  A time out procedure was performed.   The abdomen was entered in the right lower quadrant using an optical 12 mm trocar.  Upon safe entry into the abdomen, it was insufflated to 15 mmHg.  Under direct visualization, four 8 mm trocars were placed across the abdomen and the  [facility] liver retractor was placed at the epigastrium and utilized to retract the left lobe of the liver anteriorly.  It was held in position with a laparoscopic holding device clamped to the side of the bed.  The robot was docked in the standard fashion and the operation begun from the robotic console.  From patient right to left, the instruments used include tip-up retractor, fenestrated bipolar, 30 degree camera and synchroseal.     Beginning at the twelve o'clock position on the crus, the hernia sac was grasped and reduced form the mediastinum.  The hernia sac was divided to enter the plane between the sac and the mediastinum.  The hernia sac was divided towards the left and right with continued traction on the hernia sac to reduce it.  A mediastinal dissection was performed to further reduce the hernia sac which facilitated reduction of the stomach as well.  The hernia sac was disconnected from the right and left crura and subsequently, the hernia sac was dissected from the stomach and esophagus, and excised.  The short gastric vessels were divided from the inferior pole of the spleen, superiorly, to completely disconnect the stomach from the spleen and the left hemidiaphragm.  All posterior gastric attachments to the lesser sac and retroperitoneum were divided.  The left crus was further delineated.  A retroesophageal window was created and a 1/4" Penrose drain was used to encircle the esophagus for retraction.    A high, circumferential mediastinal dissection was performed in an effort to mobilize the esophagus and provide for adequate intraabdominal esophageal length.  The mediastinal dissection was performed bluntly, with  little to no thermal energy.  The anterior and posterior vagus nerves were both identified and preserved.  The crural defect was reapproximated with two posterior, interrupted, 0 Ethibond sutures.  The crural pillars came together well without tearing of the adjacent  diaphragmatic tissue.  One 0 ethibond suture was used to close the crus anteriorly taking a bite from 12 o-clock to 3 o-clock to close the hiatus around the esophagus but not narrow the hiatus.    A Nissen fundoplication was performed. A 40 French bougie was inserted into the stomach.  The fundus of the stomach was passed behind the esophagus.  A shoe-shine maneuver was performed to ensure the fundus was not twisted, and the wrap would lay in good position around the esophagus.  Three Ethibond sutures were placed to form the wrap taking bites of the greater curve on either side and the esophageal fibers in the middle.  With the wrap formed, we directed our attention to closure.  The operative field was inspected for hemostasis.  The Penrose drain was removed.  The robot was undocked and the laparoscope was inserted into the peritoneal cavity to visualize the removal of the liver retractor.    The 12 mm trocar in the lower abdomen did not require fascial closure since it was angled through the abdominal wall.  The peritoneal cavity was desufflated and the trocars removed. Local was instilled around the trocar sites using exparel and marcaine.  The incisions were closed with 4-0 Monocryl subcuticular sutures and skin glue.     Louanna Raw, MD General, Bariatric, & Minimally Invasive Surgery [facility], [place]

## 2021-02-11 NOTE — Anesthesia Procedure Notes (Signed)
Procedure Name: Intubation Date/Time: 02/11/2021 3:17 PM Performed by: Montel Clock, CRNA Pre-anesthesia Checklist: Patient identified, Emergency Drugs available, Suction available, Patient being monitored and Timeout performed Patient Re-evaluated:Patient Re-evaluated prior to induction Oxygen Delivery Method: Circle system utilized Preoxygenation: Pre-oxygenation with 100% oxygen Induction Type: IV induction Ventilation: Mask ventilation without difficulty Laryngoscope Size: Mac and 3 Grade View: Grade I Tube type: Oral Tube size: 7.0 mm Number of attempts: 1 Airway Equipment and Method: Stylet Placement Confirmation: ETT inserted through vocal cords under direct vision,  positive ETCO2 and breath sounds checked- equal and bilateral Secured at: 21 cm Tube secured with: Tape Dental Injury: Teeth and Oropharynx as per pre-operative assessment

## 2021-02-11 NOTE — Transfer of Care (Signed)
Immediate Anesthesia Transfer of Care Note  Patient: Anne Edwards  Procedure(s) Performed: XI ROBOTIC ASSISTED HIATAL HERNIA REPAIR WITH NISSAN FUNDOPLICATION (N/A )  Patient Location: PACU  Anesthesia Type:General  Level of Consciousness: awake, alert  and oriented  Airway & Oxygen Therapy: Patient Spontanous Breathing and Patient connected to face mask  Post-op Assessment: Report given to RN and Post -op Vital signs reviewed and stable  Post vital signs: Reviewed and stable  Last Vitals:  Vitals Value Taken Time  BP 143/73 02/11/21 1735  Temp    Pulse 93 02/11/21 1736  Resp 18 02/11/21 1736  SpO2 94 % 02/11/21 1736  Vitals shown include unvalidated device data.  Last Pain:  Vitals:   02/11/21 1123  TempSrc:   PainSc: 0-No pain         Complications: No complications documented.

## 2021-02-12 DIAGNOSIS — K449 Diaphragmatic hernia without obstruction or gangrene: Secondary | ICD-10-CM | POA: Diagnosis not present

## 2021-02-12 MED ORDER — OXYCODONE-ACETAMINOPHEN 5-325 MG PO TABS: 1.0000 | ORAL_TABLET | ORAL | 0 refills | Status: DC | PRN

## 2021-02-12 MED ORDER — IBUPROFEN 800 MG PO TABS: 800.0000 mg | ORAL_TABLET | Freq: Three times a day (TID) | ORAL | 0 refills | Status: AC

## 2021-02-12 MED ORDER — ACETAMINOPHEN 500 MG PO TABS
500.0000 mg | ORAL_TABLET | Freq: Four times a day (QID) | ORAL | 0 refills | Status: DC | PRN
Start: 2021-02-12 — End: 2021-07-25

## 2021-02-12 MED ORDER — ONDANSETRON HCL 4 MG PO TABS: 4.0000 mg | ORAL_TABLET | Freq: Every day | ORAL | 1 refills | Status: AC | PRN

## 2021-02-12 NOTE — Progress Notes (Signed)
Patient was given discharge instructions, and all questions were answered.  Patient was stable for discharge and was taken to the main exit by wheelchair. 

## 2021-02-12 NOTE — Discharge Summary (Signed)
Patient ID: Anne Edwards [id number] 59 y.o. 06/03/1961  02/11/2021  Discharge date and time: 02/12/2021  Admitting Physician: Shoshoni  Discharge Physician: Nickola Major Anne Edwards  Admission Diagnoses: Hiatal hernia [K44.9] Patient Active Problem List   Diagnosis Date Noted  . Gastroesophageal reflux disease   . Hiatal hernia   . Nausea without vomiting 11/26/2020  . Abdominal pain 11/26/2020  . Absolute anemia 11/25/2018  . IDA (iron deficiency anemia) 11/24/2018     Discharge Diagnoses: Hiatal hernia Patient Active Problem List   Diagnosis Date Noted  . Gastroesophageal reflux disease   . Hiatal hernia   . Nausea without vomiting 11/26/2020  . Abdominal pain 11/26/2020  . Absolute anemia 11/25/2018  . IDA (iron deficiency anemia) 11/24/2018    Operations: Procedure(s): XI ROBOTIC ASSISTED HIATAL HERNIA REPAIR WITH NISSAN FUNDOPLICATION  Admission Condition: good  Discharged Condition: good  Indication for Admission: Hiatal hernia  Hospital Course: Anne Edwards presented for hiatal hernia repair, underwent elective robotic repair with Nissen fundoplication on 0/8/65 and was discharged the following day.  Consults: None  Significant Diagnostic Studies: None  Treatments: surgery: as above  Disposition: Home  Patient Instructions:  Allergies as of 02/12/2021   No Known Allergies     Medication List    STOP taking these medications   omeprazole 40 MG capsule Commonly known as: PRILOSEC     TAKE these medications   acetaminophen 500 MG tablet Commonly known as: TYLENOL Take 1,000 mg by mouth every 6 (six) hours as needed for moderate pain. What changed: Another medication with the same name was added. Make sure you understand how and when to take each.   acetaminophen 500 MG tablet Commonly known as: TYLENOL Take 1 tablet (500 mg total) by mouth every 6 (six) hours as needed. What changed: You were already taking a medication with the same  name, and this prescription was added. Make sure you understand how and when to take each.   clonazePAM 1 MG tablet Commonly known as: KLONOPIN Take 1 mg by mouth at bedtime.   dicyclomine 10 MG capsule Commonly known as: Bentyl Take 1 capsule (10 mg total) by mouth every 12 (twelve) hours as needed (abdominal pain). What changed: when to take this   diphenhydrAMINE 25 MG tablet Commonly known as: BENADRYL Take 50 mg by mouth every morning.   escitalopram 20 MG tablet Commonly known as: LEXAPRO Take 20 mg by mouth daily.   ferrous sulfate 324 (65 Fe) MG Tbec Take 972 mg by mouth daily with breakfast.   ibuprofen 800 MG tablet Commonly known as: ADVIL Take 1 tablet (800 mg total) by mouth every 8 (eight) hours for 7 days.   ketotifen 0.025 % ophthalmic solution Commonly known as: ZADITOR Place 1 drop into both eyes daily as needed (allergies).   ondansetron 4 MG disintegrating tablet Commonly known as: Zofran ODT Take 1 tablet (4 mg total) by mouth every 8 (eight) hours as needed for nausea or vomiting.   ondansetron 4 MG tablet Commonly known as: Zofran Take 1 tablet (4 mg total) by mouth daily as needed for nausea or vomiting.   oxyCODONE-acetaminophen 5-325 MG tablet Commonly known as: Percocet Take 1 tablet by mouth every 4 (four) hours as needed for severe pain.       Activity: no lifting, driving, or strenuous exercise for 4 weeks Diet: Liquid diet, slowly introduce solid foods Wound Care: keep wound clean and dry  Follow-up:  With Dr. Thermon Leyland in 3 weeks.  Signed: [place], [place]   02/12/2021, 8:49 AM

## 2021-02-14 ENCOUNTER — Encounter (HOSPITAL_COMMUNITY): Payer: Self-pay | Admitting: Surgery

## 2021-05-01 ENCOUNTER — Other Ambulatory Visit: Payer: Self-pay | Admitting: Orthopedic Surgery

## 2021-05-01 DIAGNOSIS — M25551 Pain in right hip: Secondary | ICD-10-CM

## 2021-05-03 ENCOUNTER — Ambulatory Visit
Admission: RE | Admit: 2021-05-03 | Discharge: 2021-05-03 | Disposition: A | Discharge status: Home / Self Care | Payer: 59 | Source: Ambulatory Visit | Attending: Orthopedic Surgery | Admitting: Orthopedic Surgery

## 2021-05-03 ENCOUNTER — Other Ambulatory Visit: Payer: Self-pay

## 2021-05-03 DIAGNOSIS — M25551 Pain in right hip: Secondary | ICD-10-CM

## 2021-05-27 ENCOUNTER — Ambulatory Visit (INDEPENDENT_AMBULATORY_CARE_PROVIDER_SITE_OTHER): Payer: 59 | Admitting: Gastroenterology

## 2021-07-25 ENCOUNTER — Other Ambulatory Visit: Payer: Self-pay

## 2021-07-25 ENCOUNTER — Ambulatory Visit (INDEPENDENT_AMBULATORY_CARE_PROVIDER_SITE_OTHER): Payer: 59 | Admitting: Gastroenterology

## 2021-07-25 ENCOUNTER — Encounter (INDEPENDENT_AMBULATORY_CARE_PROVIDER_SITE_OTHER): Payer: Self-pay | Admitting: Gastroenterology

## 2021-07-25 VITALS — BP 135/69 | HR 80 | Temp 98.0°F | Ht 62.0 in | Wt 175.9 lb

## 2021-07-25 DIAGNOSIS — D509 Iron deficiency anemia, unspecified: Secondary | ICD-10-CM | POA: Diagnosis not present

## 2021-07-25 DIAGNOSIS — R112 Nausea with vomiting, unspecified: Secondary | ICD-10-CM

## 2021-07-25 DIAGNOSIS — K582 Mixed irritable bowel syndrome: Secondary | ICD-10-CM | POA: Diagnosis not present

## 2021-07-25 DIAGNOSIS — K219 Gastro-esophageal reflux disease without esophagitis: Secondary | ICD-10-CM

## 2021-07-25 MED ORDER — OMEPRAZOLE 40 MG PO CPDR: 40.0000 mg | DELAYED_RELEASE_CAPSULE | Freq: Every day | ORAL | 3 refills | Status: DC

## 2021-07-25 MED ORDER — DICYCLOMINE HCL 10 MG PO CAPS: 10.0000 mg | ORAL_CAPSULE | Freq: Three times a day (TID) | ORAL | 3 refills | Status: DC

## 2021-07-25 NOTE — Progress Notes (Signed)
Referring Provider: Rosalee Kaufman, * Primary Care Physician:  Rosalee Kaufman, PA-C Primary GI Physician: Jenetta Downer  Chief Complaint  Patient presents with   Follow-up    6 month follow up. Pt states she has been having vomiting and nausea after eating. Abdominal cramping, goes back and forth between diarrhea and constipation. All symptoms started after hernia surgery last May. Has found out that she is not able drink milk.    HPI:   Anne Edwards is a 60 y.o. female with past medical history of small bowel AVMs, IDA and anxiety.  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 Last seen in clinic feb 2022.   ZWC:HENI hgb 12.9 (02/11/21), Iron TIBC and iron sats were all WNL 08/26/20. She is currently on 972mg  ferrous sulfate once daily. She was started on omeprazole 40mg  daily for cameron erosions noted on EGD in feb 2022 but is not taking this currently. She is still taking her iron supplements, having some dark stools but denies any blood in her stools, she denies any sob, fatigue or dizziness.   Admission to [facility] [place] on 08/26/20 with symptomatic anemia, sob, fatigue, no melena or hemathochezia at that time. She was on oral iron TID at that time. Hgb 5.6 on admission with MCV 103, she required 3 units PRBCs, repeat hgb qwas 10.3 the next day, she had previous hip surgery just prior on 08/06/20.  Previously she had capsule endoscopy on 11/24/18 where she was found to have 4 non bleeding small AVMs throughout small bowel.   IBS: patient continues to have alternating constipation and diarrhea. Testing for celiac disease in February was negative, low FODMAP diet was discussed at last visit, and  bentyl 10mg  q12 hours.  She reports that she did not try the low FODMAP diet or the bentyl. She reports ongoing diarrhea and constipation. She states that she sometimes has fecal incontinence, especially after eating certain foods.  Denies any nocturnal fecal incontinence. She can sometimes go 3-4 days without a BM, but other days she will have 2-3 episodes of diarrhea. She reports that she has recently noticed that she cannot tolerate drinking cows milk, as it started to really upset her stomach, with cramping and nausea. She has switched to almond milk which she tolerates well.  Nausea and vomiting: this has been an ongoing issue, usually occurs postprandial, she uses zofran as needed, taking this at least twice per week. She reports that after she eats she usually gets very nauseated and has upper abdominal cramping. She reports that she will occasionally have an episode of vomiting depending on what she eats. She does endorse some symptoms of reflux, almost daily. She is not currently on PPI or H2B, she takes tums as needed. She does report decreased appetite due to nausea. She has lost about 15 pounds since her hiatal hernia repair in May 2022.   Notably patient had normal esophageal manometry study in March 2022 prior to hiatal hernia repair w/nissan fundoplication done by [facility]  Last Colonoscopy:(02/04/18)Dr. Cathey, 3 polyps between 5 to 9 mm and 3 cm from anal verge, recommended repeat colonoscopy in 2024. Last Endoscopy:(11/28/20)6cm hh with a few cameron erosions, normal stomach, normal examined duodenum, no specimens collected.    Past Medical History:  Diagnosis Date   Anxiety    Arthritis    Complication of anesthesia    Slow to wake up   History of hiatal hernia  IDA (iron deficiency anemia) 11/24/2018   Pneumonia     Past Surgical History:  Procedure Laterality Date   CHOLECYSTECTOMY     ESOPHAGEAL MANOMETRY N/A 01/02/2021   Procedure: ESOPHAGEAL MANOMETRY (EM);  Surgeon: Mauri Pole, MD;  Location: [facility];  Service: Endoscopy;  Laterality: N/A;   ESOPHAGOGASTRODUODENOSCOPY (EGD) WITH PROPOFOL N/A 11/28/2020   Procedure: ESOPHAGOGASTRODUODENOSCOPY (EGD) WITH PROPOFOL;   Surgeon: Harvel Quale, MD;  Location: [facility];  Service: Gastroenterology;  Laterality: N/A;  11:00   GIVENS CAPSULE STUDY N/A 12/01/2018   Procedure: GIVENS CAPSULE STUDY;  Surgeon: Rogene Houston, MD;  Location: [facility];  Service: Endoscopy;  Laterality: N/A;  7:30   JOINT REPLACEMENT Left 07/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 Takes as needed     clonazePAM (KLONOPIN) 1 MG tablet Take 1 mg by mouth at bedtime.     diphenhydrAMINE (BENADRYL) 25 MG tablet Take 50 mg by mouth every morning.     escitalopram (LEXAPRO) 20 MG tablet Take 20 mg by mouth daily.     ferrous sulfate 324 (65 Fe) MG TBEC Take 972 mg by mouth daily with breakfast.     ondansetron (ZOFRAN) 4 MG tablet Take 1 tablet (4 mg total) by mouth daily as needed for nausea or vomiting. 30 tablet 1   No current facility-administered medications for this visit.    Allergies as of 07/25/2021   (No Known Allergies)    History reviewed. No pertinent family history.  Social History   Socioeconomic History   Marital status: Married    Spouse name: Not on file   Number of children: Not on file   Years of education: Not on file   Highest education level: Not on file  Occupational History   Not on file  Tobacco Use   Smoking status: Never   Smokeless tobacco: Never  Vaping Use   Vaping Use: Never used  Substance and Sexual Activity   Alcohol use: Never   Drug use: Never   Sexual activity: Not Currently  Other Topics Concern   Not on file  Social History Narrative   Not on file   Social Determinants of Health   Financial  Resource Strain: Not on file  Food Insecurity: Not on file  Transportation Needs: Not on file  Physical Activity: Not on file  Stress: Not on file  Social Connections: Not on file   Review of Systems: Gen: Denies fever, chills, anorexia. Denies fatigue, weakness, +weight loss CV: Denies chest pain, palpitations, syncope, peripheral edema, and claudication. Resp: Denies dyspnea at rest, cough, wheezing, coughing up blood, and pleurisy. GI: Denies  melena, hematochezia, nausea, dysphagia, odyonophagia, early satiety . Endorses nausea vomiting, diarrhea, constipation, weight loss.  Derm: Denies rash, itching, dry skin Psych: Denies depression, anxiety, memory loss, confusion. No homicidal or suicidal ideation.  Heme: Denies bruising, bleeding, and enlarged lymph nodes.  Physical Exam: BP 135/69 (BP Location: Right Arm, Patient Position: Sitting, Cuff Size: Large)   Pulse 80   Temp 98 F (36.7 C) (Oral)   Ht 5\' 2"  (1.575 m)  Wt 175 lb 14.4 oz (79.8 kg)   BMI 32.17 kg/m  General:   Alert and oriented. No distress noted. Pleasant and cooperative.  Head:  Normocephalic and atraumatic. Eyes:  Conjuctiva clear without scleral icterus. Mouth:  Oral mucosa pink and moist. Good dentition. No lesions. Heart: Normal rate and rhythm, s1 and s2 heart sounds present.  Lungs: Clear lung sounds in all lobes. Respirations equal and unlabored. Abdomen:  +BS, soft, non-tender and non-distended. No rebound or guarding. No HSM or masses noted. Derm: No palmar erythema or jaundice Msk:  Symmetrical without gross deformities. Normal posture. Extremities:  Without edema. Neurologic:  Alert and  oriented x4 Psych:  Alert and cooperative. Normal mood and affect.  Invalid input(s): 6 MONTHS   ASSESSMENT: Anne Edwards is a 60 y.o. female presenting today for follow up of IDA, IBS-mixed, ongoing postprandial nausea and vomiting.  Patient with history of IDA, currently on 972mg  of ferrrous sulfate daily,  last hgb 12.9 in May 2022. She denies rectal bleeding, melena, sob, fatigue or dizziness. She has not had any further episodes requiring blood transfusion since [facility] in Nov 2021. She should continue her oral iron supplementation at this time.   She continues to have intermittent diarrhea and constipation. She will sometimes go 3-4 days without a BM then will have 2-3 episodes of diarrhea some days, I suspect she has some overflow diarrhea, secondary to her constipation. She was instructed at last visit to try low FODMAP diet and bentyl 10mg  every 12 hours as needed for abdominal cramping, however, she did not try either of these. We discussed low FODMAP diet and keeping track of and avoiding foods that seem to be triggers of her symptoms. I will send Rx bentyl 10mg  for her to take every 12 hours as needed for abdominal cramping. I have also instructed her to start benefiber 4g daily to see if this helps with more regular, formed stools. She will let me know if she has not seen any results from this in a couple of weeks.   She continues to experience postprandial nausea and vomiting, she reports mostly nausea and abdominal cramping. She will occasionally have an episode of vomiting, depending on what she ate, she is also having almost daily reflux symptoms, taking tums as needed, she was previously supposed to start on omeprazole 40mg  once daily after cameron lesions were found on EGD in Feb 2022, however, she is unsure if she was ever on this. I will send Rx for omeprazole 40mg  to her pharmacy for her to take daily 30-45 minutes prior to breakfast. She should continue zofran as needed. I suspect that some of her symptoms could be related to reflux as well as suspected IBS. Esophageal manometry in March 2022 was normal. She denies any dysphagia or odynophagia.    PLAN:  Benefiber otc, 4g/day 2. Bentyl every 12 hours as needed 3. Omeprazole 40mg  daily 4. Continue zofran as needed 5. Low FODMAP  diet 6. Stay well hydrated  Follow Up: 6 month  Lean Fayson L. Alver Sorrow, MSN, APRN, AGNP-C Adult-Gerontology Nurse Practitioner [facility]

## 2021-07-25 NOTE — Patient Instructions (Addendum)
I have provided information on the low FODMAP diet, please take a look at this and try to keep track of foods that seem to worsen your symptoms. Continue to drink plenty of water.  I am sending:  Bentyl 10mg  every 12 hours as needed for abdominal cramping Omeprazole 40mg  once daily for acid reflux, take this one every morning 30-45 minutes prior to eating  Start benefiber for your constipation, this is over the counter, you can start with 4 grams once daily and increase this dose to 4g up to 3 times per day, if in 1-2 weeks you do not notice that you are having more frequent, formed bowel movements, please let me know.

## 2021-10-25 IMAGING — MR MR HIP*R* W/O CM
7 series · 39 of 40 positions shown · non-contrast
Comparison: None.

CLINICAL DATA: Right hip pain

EXAM:
MR OF THE RIGHT HIP WITHOUT CONTRAST
TECHNIQUE: Multiplanar, multisequence MR imaging was performed. No intravenous
contrast was administered.

[Series 8: T2 fat-sat · coronal · right · 3.0mm · 1.04mm/px · 4 of 30 slices shown (1 of 2)]
[im 1/30]
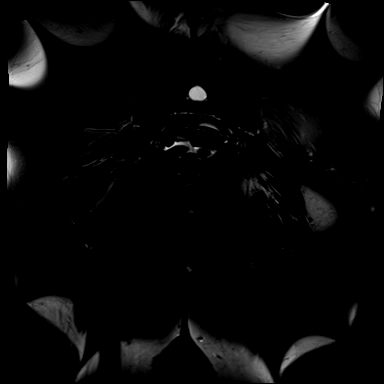
[im 10/30]
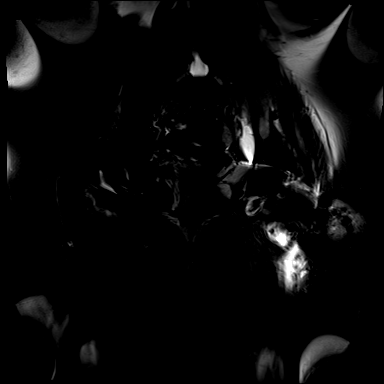
[im 20/30]
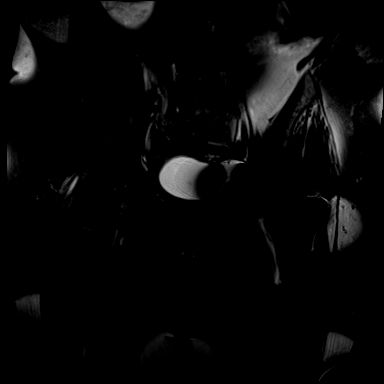
[im 30/30]
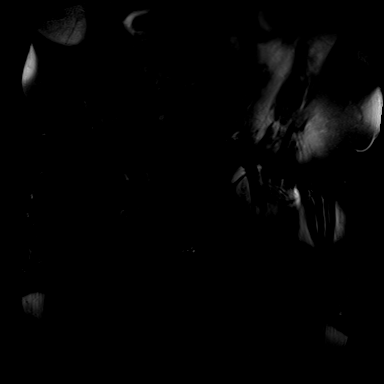

[Series 9: T1 · coronal · right · 3.0mm · 1.04mm/px · 5 of 30 slices shown]
[im 1/30]
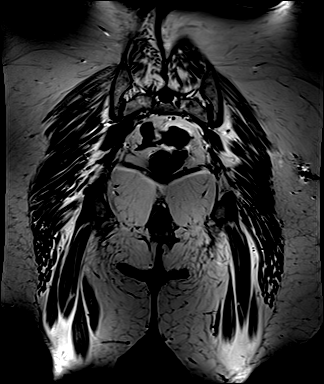
[im 8/30]
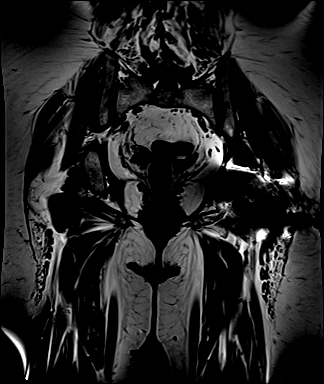
[im 15/30]
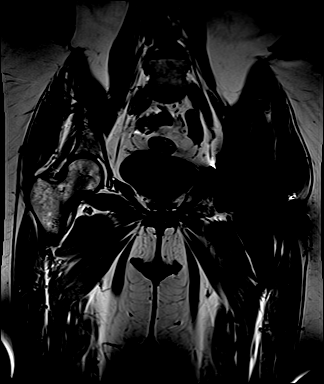
[im 22/30]
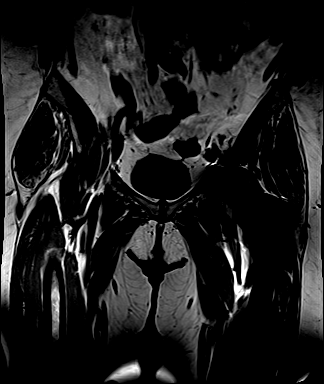
[im 30/30]
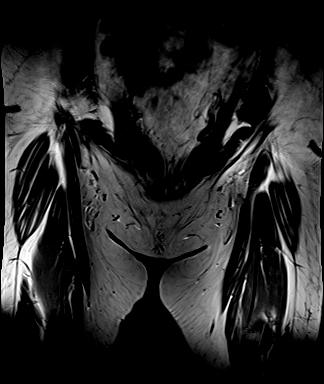

[Series 10: T2 fat-sat · axial · right · 3.0mm · 1.48mm/px · z∈[-66,+71]mm · 7 of 39 slices shown (2 of 2)]
[im 1/39]
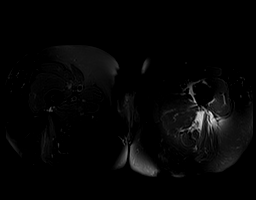
[im 7/39]
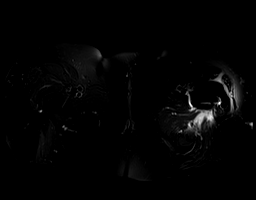
[im 13/39]
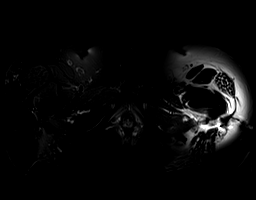
[im 20/39]
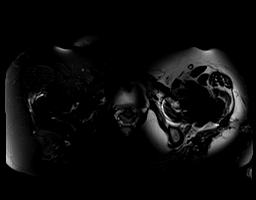
[im 26/39]
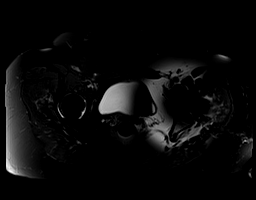
[im 32/39]
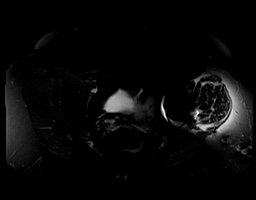
[im 39/39]
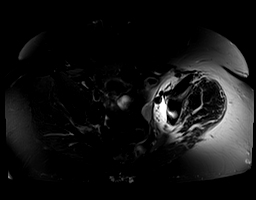

[Series 11: STIR · axial · right · 3.0mm · 1.48mm/px · z∈[-66,+71]mm · 7 of 39 slices shown (1 of 2)]
[im 1/39]
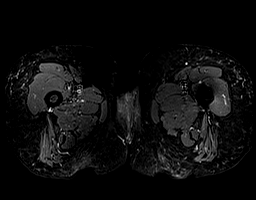
[im 7/39]
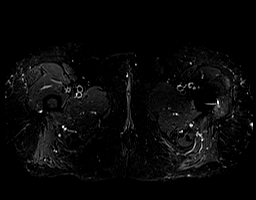
[im 13/39]
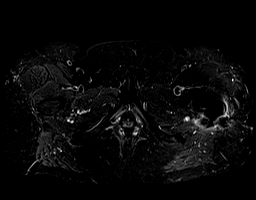
[im 20/39]
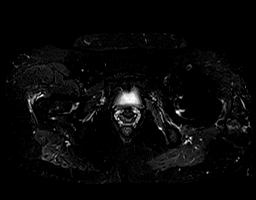
[im 26/39]
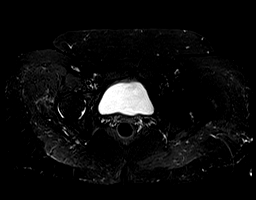
[im 32/39]
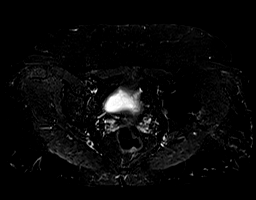
[im 39/39]
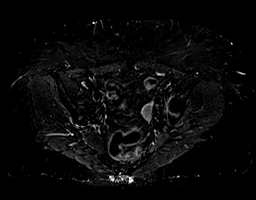

[Series 12: PD fat-sat · coronal · right · 3.0mm · 0.56mm/px · 5 of 28 slices shown (1 of 2)]
[im 1/28]
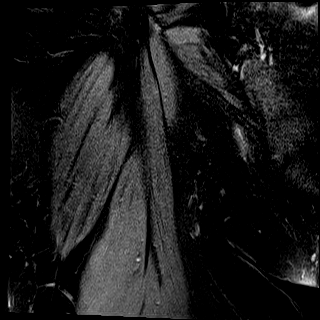
[im 7/28]
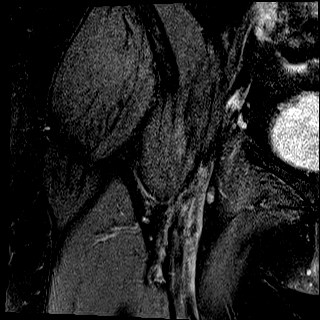
[im 14/28]
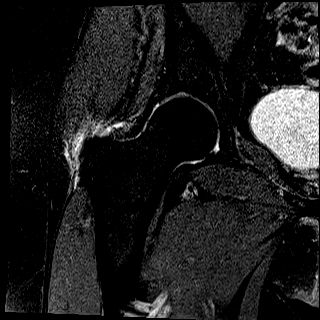
[im 21/28]
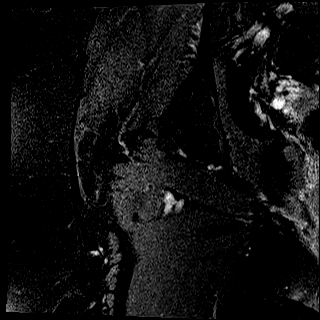
[im 28/28]
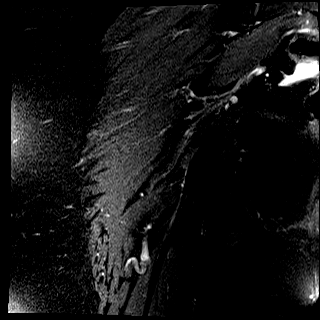

[Series 13: PD fat-sat · sagittal · right · 3.0mm · 0.56mm/px · 6 of 35 slices shown (2 of 2)]
[im 1/35]
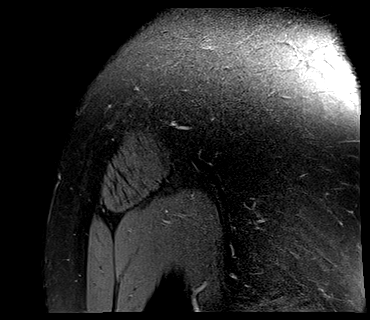
[im 7/35]
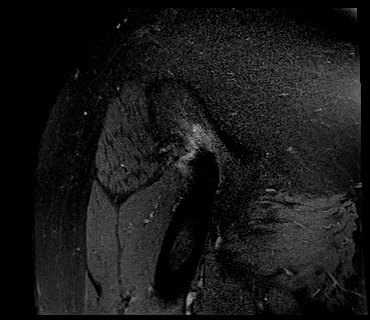
[im 14/35]
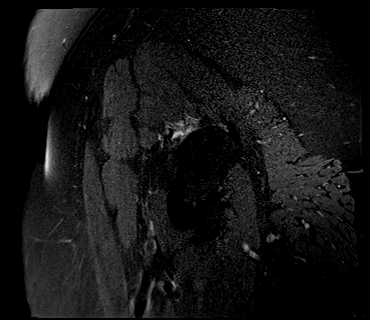
[im 21/35]
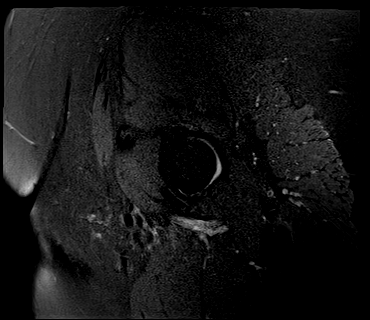
[im 28/35]
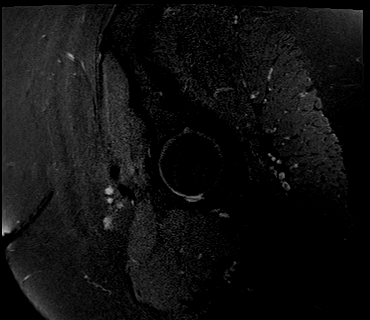
[im 35/35]
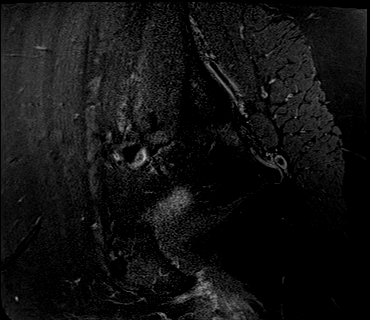

[Series 14: STIR · sagittal · right · 3.0mm · 0.35mm/px · 5 of 35 slices shown (2 of 2)]
[im 1/35]
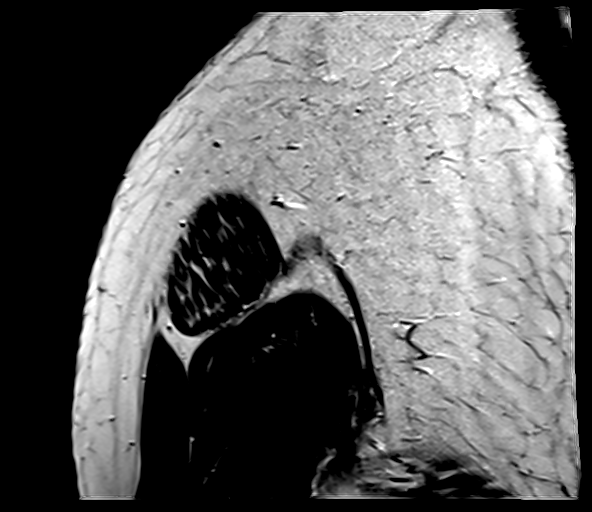
[im 7/35]
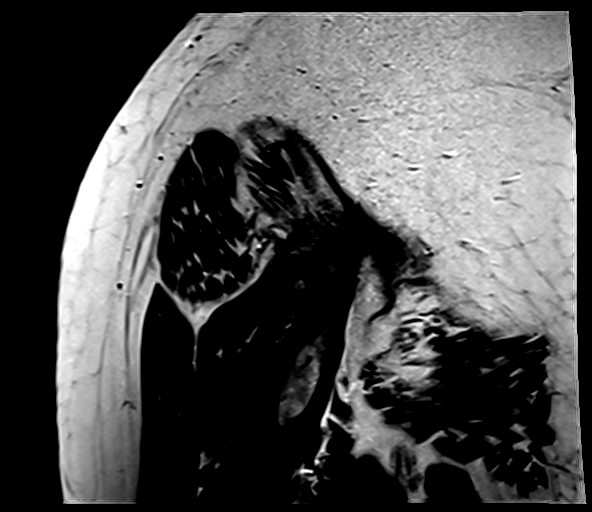
[im 14/35]
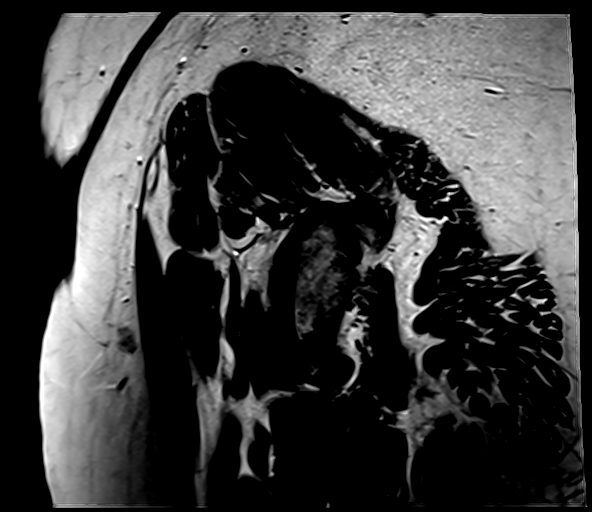
[im 21/35]
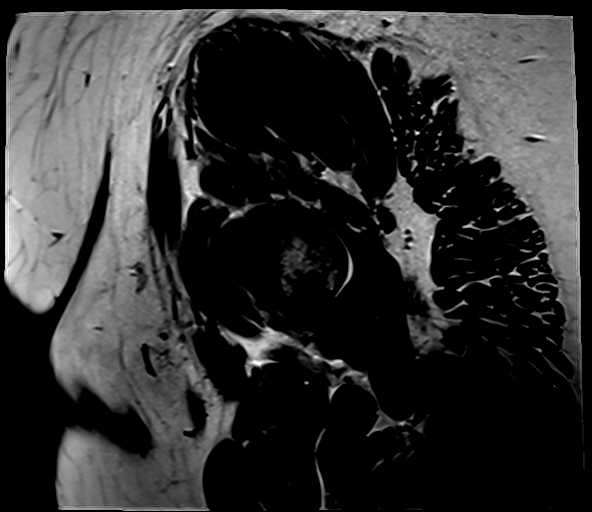
[im 28/35]
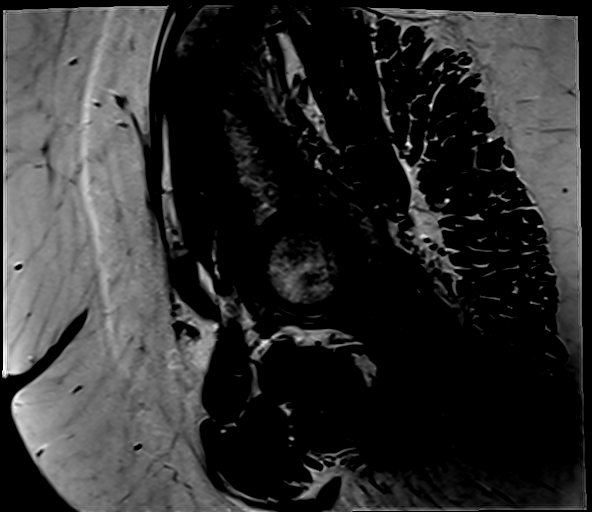

[39 of 40 positions shown; findings below may reference images not displayed]

FINDINGS: Bones: No evidence of acute fracture, dislocation, or avascular
necrosis. There is a left hip arthroplasty with associated
susceptibility artifact. No focal bone lesion. The visualized
sacroiliac joints and symphysis pubis appear normal.

Articular cartilage and labrum

Articular cartilage:  Mild chondrosis.

Labrum: Mild anterior superior labral fraying without displaced
tear.

Joint or bursal effusion

Joint effusion: Minimal hip effusion.

Bursae: Mild sub minimus bursal distension.

Muscles and tendons

Muscles and tendons: High-grade partial tearing of the right distal
gluteus minimus tendon at its insertion on the greater trochanter.
Mild reactive edema. The gluteus medius tendon is intact. The
proximal hamstrings are intact. The adductors are intact.

Other findings

Miscellaneous: Prior hysterectomy.
IMPRESSION: High-grade partial tearing of the right distal gluteus minimus
tendon with mild sub minimus bursitis.

Mild right hip chondrosis. Anterior superior labral fraying. No
displaced labral tear.

## 2022-01-23 ENCOUNTER — Ambulatory Visit (INDEPENDENT_AMBULATORY_CARE_PROVIDER_SITE_OTHER): Payer: 59 | Admitting: Gastroenterology

## 2022-03-06 ENCOUNTER — Ambulatory Visit (INDEPENDENT_AMBULATORY_CARE_PROVIDER_SITE_OTHER): Payer: 59 | Admitting: Gastroenterology

## 2022-07-15 DIAGNOSIS — M7742 Metatarsalgia, left foot: Secondary | ICD-10-CM | POA: Diagnosis not present

## 2022-07-15 DIAGNOSIS — M7741 Metatarsalgia, right foot: Secondary | ICD-10-CM | POA: Diagnosis not present

## 2022-07-15 DIAGNOSIS — M79673 Pain in unspecified foot: Secondary | ICD-10-CM | POA: Diagnosis not present

## 2022-07-18 DIAGNOSIS — M25561 Pain in right knee: Secondary | ICD-10-CM | POA: Diagnosis not present

## 2022-07-18 DIAGNOSIS — M25551 Pain in right hip: Secondary | ICD-10-CM | POA: Diagnosis not present

## 2022-07-21 DIAGNOSIS — M4126 Other idiopathic scoliosis, lumbar region: Secondary | ICD-10-CM | POA: Diagnosis not present

## 2022-07-21 DIAGNOSIS — M5416 Radiculopathy, lumbar region: Secondary | ICD-10-CM | POA: Diagnosis not present

## 2022-07-21 DIAGNOSIS — Z6831 Body mass index (BMI) 31.0-31.9, adult: Secondary | ICD-10-CM | POA: Diagnosis not present

## 2022-07-21 DIAGNOSIS — M4316 Spondylolisthesis, lumbar region: Secondary | ICD-10-CM | POA: Diagnosis not present

## 2022-07-23 DIAGNOSIS — R634 Abnormal weight loss: Secondary | ICD-10-CM | POA: Diagnosis not present

## 2022-07-23 DIAGNOSIS — E042 Nontoxic multinodular goiter: Secondary | ICD-10-CM | POA: Diagnosis not present

## 2022-07-23 DIAGNOSIS — R5383 Other fatigue: Secondary | ICD-10-CM | POA: Diagnosis not present

## 2022-07-23 DIAGNOSIS — K219 Gastro-esophageal reflux disease without esophagitis: Secondary | ICD-10-CM | POA: Diagnosis not present

## 2022-07-23 DIAGNOSIS — D649 Anemia, unspecified: Secondary | ICD-10-CM | POA: Diagnosis not present

## 2022-07-23 DIAGNOSIS — E039 Hypothyroidism, unspecified: Secondary | ICD-10-CM | POA: Diagnosis not present

## 2022-07-29 DIAGNOSIS — M5416 Radiculopathy, lumbar region: Secondary | ICD-10-CM | POA: Diagnosis not present

## 2022-07-30 DIAGNOSIS — Z1331 Encounter for screening for depression: Secondary | ICD-10-CM | POA: Diagnosis not present

## 2022-07-30 DIAGNOSIS — N951 Menopausal and female climacteric states: Secondary | ICD-10-CM | POA: Diagnosis not present

## 2022-07-30 DIAGNOSIS — Z6833 Body mass index (BMI) 33.0-33.9, adult: Secondary | ICD-10-CM | POA: Diagnosis not present

## 2022-07-30 DIAGNOSIS — M419 Scoliosis, unspecified: Secondary | ICD-10-CM | POA: Diagnosis not present

## 2022-07-30 DIAGNOSIS — F419 Anxiety disorder, unspecified: Secondary | ICD-10-CM | POA: Diagnosis not present

## 2022-07-30 DIAGNOSIS — R300 Dysuria: Secondary | ICD-10-CM | POA: Diagnosis not present

## 2022-07-30 DIAGNOSIS — D649 Anemia, unspecified: Secondary | ICD-10-CM | POA: Diagnosis not present

## 2022-07-30 DIAGNOSIS — G2581 Restless legs syndrome: Secondary | ICD-10-CM | POA: Diagnosis not present

## 2022-07-30 DIAGNOSIS — E039 Hypothyroidism, unspecified: Secondary | ICD-10-CM | POA: Diagnosis not present

## 2022-07-30 DIAGNOSIS — M545 Low back pain, unspecified: Secondary | ICD-10-CM | POA: Diagnosis not present

## 2022-08-04 ENCOUNTER — Ambulatory Visit (INDEPENDENT_AMBULATORY_CARE_PROVIDER_SITE_OTHER): Payer: Self-pay | Admitting: Gastroenterology

## 2022-08-08 DIAGNOSIS — M1711 Unilateral primary osteoarthritis, right knee: Secondary | ICD-10-CM | POA: Diagnosis not present

## 2022-08-11 ENCOUNTER — Other Ambulatory Visit (INDEPENDENT_AMBULATORY_CARE_PROVIDER_SITE_OTHER): Payer: Self-pay

## 2022-08-11 ENCOUNTER — Ambulatory Visit (INDEPENDENT_AMBULATORY_CARE_PROVIDER_SITE_OTHER): Payer: Self-pay | Admitting: Gastroenterology

## 2022-08-11 ENCOUNTER — Encounter (INDEPENDENT_AMBULATORY_CARE_PROVIDER_SITE_OTHER): Payer: Self-pay

## 2022-08-11 ENCOUNTER — Telehealth (INDEPENDENT_AMBULATORY_CARE_PROVIDER_SITE_OTHER): Payer: Self-pay

## 2022-08-11 ENCOUNTER — Encounter (INDEPENDENT_AMBULATORY_CARE_PROVIDER_SITE_OTHER): Payer: Self-pay | Admitting: Gastroenterology

## 2022-08-11 VITALS — BP 147/68 | HR 97 | Temp 98.2°F | Ht 62.0 in | Wt 172.5 lb

## 2022-08-11 DIAGNOSIS — K582 Mixed irritable bowel syndrome: Secondary | ICD-10-CM

## 2022-08-11 DIAGNOSIS — R109 Unspecified abdominal pain: Secondary | ICD-10-CM

## 2022-08-11 DIAGNOSIS — R1319 Other dysphagia: Secondary | ICD-10-CM

## 2022-08-11 DIAGNOSIS — K219 Gastro-esophageal reflux disease without esophagitis: Secondary | ICD-10-CM

## 2022-08-11 MED ORDER — PEG 3350-KCL-NA BICARB-NACL 420 G PO SOLR: 4000.0000 mL | ORAL | 0 refills | Status: DC

## 2022-08-11 NOTE — Telephone Encounter (Signed)
Vada Swift Ann Kamela Blansett, CMA  ?

## 2022-08-11 NOTE — Patient Instructions (Addendum)
Schedule EGD and colonoscopy If negative EGD, will proceed with gastric emptying study May consider Xifaxan trial depending on endoscopic findings

## 2022-08-11 NOTE — Progress Notes (Unsigned)
Maylon Peppers, M.D. Gastroenterology & Hepatology [facility] [address] [place], [place] [address]  Primary Care Physician: Rosalee Kaufman, PA-C [address] [place] [address]  I will communicate my assessment and recommendations to the referring MD via EMR.  Problems: Dysphagia Heartburn Abdominal discomfort Changes in bowel movements  History of Present Illness: Anne Edwards is a 61 y.o. 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  The patient was last seen on 07/25/2021. At that time, the patient was continued on Benefiber, Bentyl as needed, omeprazole 40 mg every day and was advised to continue a low FODMAP diet.  Patient reports that for the last 6 months she has had dysphagia when eating solids but not with liquids. She feels that the food "stops in the upper abdomen". Very occasionally she has episodes of regurgitation. She reports having recurrent heartburn episodes after eating, for which she takes omeprazole 40 mg as needed when she presents the heartburn (sometimes takes it twice a day) - it helps some relieving her heartburn but does not completely resolve it. She also reports that she feels her food stay in her abdomen for long time and this causes significant nausea as she feels significant pressure in the upper abdomen. She can have some epigastric pain sometimes. She also has  intermittent bloating episodes.  She also reports a chronic history of constipation that lasts for 3-4 days, with intermittent episodes of diarrhea (2-3 episodes of diarrhea that eventually subside). No melena or hematochezia, although sometimes she has some fresh blood in stool.  Patient underwent hiatal hernia repair and  Niseen fudoplication on  05/13/8298 with Dr. Molinda Bailiff. [place].  The patient denies having any fever, chills, hematochezia, melena, hematemesis,  jaundice, pruritus or weight loss.  No recent labs (2023) are available.  Last Colonoscopy:(02/04/18)Dr. Cathey, 3 polyps between 5 to 9 mm and 3 cm from anal verge, recommended repeat colonoscopy in 2024. Last Endoscopy:(11/28/20)6cm hh with a few cameron erosions, normal stomach, normal examined duodenum, no specimens collected.   Past Medical History: Past Medical History:  Diagnosis Date   Anxiety    Arthritis    Complication of anesthesia    Slow to wake up   History of hiatal hernia    IDA (iron deficiency anemia) 11/24/2018   Pneumonia     Past Surgical History: Past Surgical History:  Procedure Laterality Date   CHOLECYSTECTOMY     COLONOSCOPY  02/04/2018   Dr. Ladona Horns, 3 polyps between 5-67m and 3 cm from anal verge, repeat colonoscopy in 5 years   ESOPHAGEAL MANOMETRY N/A 01/02/2021   Procedure: ESOPHAGEAL MANOMETRY (EM);  Surgeon: NMauri Pole MD;  Location: [facility];  Service: Endoscopy;  Laterality: N/A;   ESOPHAGOGASTRODUODENOSCOPY (EGD) WITH PROPOFOL N/A 11/28/2020   Castaneda:6cm hh with a few cameron erosions, normal stomach, normal examined duodenum, no specimens collected.   GIVENS CAPSULE STUDY N/A 12/01/2018   Procedure: GIVENS CAPSULE STUDY;  Surgeon: RRogene Houston MD;  [facility]: [facility];  Service: Endoscopy;  Laterality: N/A;  7:30   JOINT REPLACEMENT Left 07/2020   total L hip   PARTIAL HYSTERECTOMY     XI ROBOTIC ASSISTED HIATAL HERNIA REPAIR N/A 02/11/2021   Procedure: XI ROBOTIC ASSISTED HIATAL HERNIA REPAIR WITH NISSAN FUNDOPLICATION;  Surgeon: Felicie Morn, MD;  Location: [facility];  Service: General;  Laterality: N/A;    Family History:History reviewed. No pertinent family history.  Social History: Social History   Tobacco Use  Smoking Status Never  Smokeless Tobacco Never    Social History   Substance and Sexual Activity  Alcohol Use Never   Social History   Substance and Sexual Activity  Drug Use Never    Allergies: No Known Allergies  Medications: Current Outpatient Medications  Medication Sig Dispense Refill   acetaminophen (TYLENOL) 650 MG CR tablet Take 650 mg by mouth. Takes as needed     clonazePAM (KLONOPIN) 1 MG tablet Take 1 mg by mouth at bedtime.     dicyclomine (BENTYL) 10 MG capsule Take 1 capsule (10 mg total) by mouth 3 (three) times daily before meals. 90 capsule 3   escitalopram (LEXAPRO) 20 MG tablet Take 20 mg by mouth daily.     ferrous sulfate 324 (65 Fe) MG TBEC Take 324 mg by mouth daily with breakfast.     meloxicam (MOBIC) 7.5 MG tablet Take 7.5 mg by mouth daily.     omeprazole (PRILOSEC) 40 MG capsule Take 1 capsule (40 mg total) by mouth daily. 30 capsule 3   No current facility-administered medications for this visit.    Review of Systems: GENERAL: negative for malaise, night sweats HEENT: No changes in hearing or vision, no nose bleeds or other nasal problems. NECK: Negative for lumps, goiter, pain and significant neck swelling RESPIRATORY: Negative for cough, wheezing CARDIOVASCULAR: Negative for chest pain, leg swelling, palpitations, orthopnea GI: SEE HPI MUSCULOSKELETAL: Negative for joint pain or swelling, back pain, and muscle pain. SKIN: Negative for lesions, rash PSYCH: Negative for sleep disturbance, mood disorder and recent psychosocial stressors. HEMATOLOGY Negative for prolonged bleeding, bruising easily, and swollen nodes. ENDOCRINE: Negative for cold or heat intolerance, polyuria, polydipsia and goiter. NEURO: negative for tremor, gait imbalance, syncope and seizures. The remainder of the review of systems is noncontributory.   Physical Exam: BP (!) 147/68 (BP Location: Right Arm, Patient Position: Sitting, Cuff Size: Large)   Pulse 97   Temp 98.2 F (36.8 C) (Oral)   Ht '5\' 2"'$  (1.575 m)    Wt 172 lb 8 oz (78.2 kg)   BMI 31.55 kg/m  GENERAL: The patient is AO x3, in no acute distress. HEENT: Head is normocephalic and atraumatic. EOMI are intact. Mouth is well hydrated and without lesions. NECK: Supple. No masses LUNGS: Clear to auscultation. No presence of rhonchi/wheezing/rales. Adequate chest expansion HEART: RRR, normal s1 and s2. ABDOMEN: mildly tender in the epigastric area, no guarding, no peritoneal signs, and nondistended. BS +. No masses. EXTREMITIES: Without any cyanosis, clubbing, rash, lesions or edema. NEUROLOGIC: AOx3, no focal motor deficit. SKIN: no jaundice, no rashes  Imaging/Labs: as above  I personally reviewed and interpreted the available labs, imaging and endoscopic files.  Impression and Plan: Anne Edwards is a 61 y.o. 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  The patient has presented a wide array of different gastrointestinal symptoms.  Part of her upper gastrointestinal complaints could be related to gastroparesis but given the chronicity of her symptoms we will need to explore this further with an EGD with possible dilation.  If no findings explain her current  presentation, she may need to undergo a gastric emptying study.  Also, she has presented a chronic change in her bowel movement frequency and consistency, which could be related to some underlying irritable bowel syndrome but we will explore this further with a colonoscopy.  Can also consider a potential 2-week trial with rifaximin for IBS-M.  -Schedule EGD and colonoscopy -If negative EGD, will proceed with gastric emptying study -May consider Xifaxan trial depending on endoscopic findings  All questions were answered.      Maylon Peppers, MD Gastroenterology and  Hepatology [facility]

## 2022-08-13 DIAGNOSIS — R1319 Other dysphagia: Secondary | ICD-10-CM | POA: Insufficient documentation

## 2022-08-15 DIAGNOSIS — M1711 Unilateral primary osteoarthritis, right knee: Secondary | ICD-10-CM | POA: Diagnosis not present

## 2022-08-19 DIAGNOSIS — H5203 Hypermetropia, bilateral: Secondary | ICD-10-CM | POA: Diagnosis not present

## 2022-08-19 DIAGNOSIS — H524 Presbyopia: Secondary | ICD-10-CM | POA: Diagnosis not present

## 2022-08-20 DIAGNOSIS — M25552 Pain in left hip: Secondary | ICD-10-CM | POA: Diagnosis not present

## 2022-08-20 DIAGNOSIS — D649 Anemia, unspecified: Secondary | ICD-10-CM | POA: Diagnosis not present

## 2022-08-20 DIAGNOSIS — R609 Edema, unspecified: Secondary | ICD-10-CM | POA: Diagnosis not present

## 2022-08-20 DIAGNOSIS — M79672 Pain in left foot: Secondary | ICD-10-CM | POA: Diagnosis not present

## 2022-08-22 DIAGNOSIS — M1711 Unilateral primary osteoarthritis, right knee: Secondary | ICD-10-CM | POA: Diagnosis not present

## 2022-09-22 DIAGNOSIS — H524 Presbyopia: Secondary | ICD-10-CM | POA: Diagnosis not present

## 2022-09-23 DIAGNOSIS — M7662 Achilles tendinitis, left leg: Secondary | ICD-10-CM | POA: Diagnosis not present

## 2022-09-23 DIAGNOSIS — M79672 Pain in left foot: Secondary | ICD-10-CM | POA: Diagnosis not present

## 2022-09-24 ENCOUNTER — Encounter (HOSPITAL_COMMUNITY)
Admission: RE | Disposition: A | Discharge status: Home / Self Care | Payer: Self-pay | Source: Home / Self Care | Attending: Gastroenterology

## 2022-09-24 ENCOUNTER — Ambulatory Visit (HOSPITAL_COMMUNITY): Payer: Medicare HMO | Admitting: Anesthesiology

## 2022-09-24 ENCOUNTER — Ambulatory Visit (HOSPITAL_BASED_OUTPATIENT_CLINIC_OR_DEPARTMENT_OTHER): Payer: Medicare HMO | Admitting: Anesthesiology

## 2022-09-24 ENCOUNTER — Ambulatory Visit (HOSPITAL_COMMUNITY)
Admission: RE | Admit: 2022-09-24 | Discharge: 2022-09-24 | Disposition: A | Discharge status: Home / Self Care | Payer: Medicare HMO | Attending: Gastroenterology | Admitting: Gastroenterology

## 2022-09-24 ENCOUNTER — Encounter (HOSPITAL_COMMUNITY): Payer: Self-pay | Admitting: Gastroenterology

## 2022-09-24 ENCOUNTER — Other Ambulatory Visit: Payer: Self-pay

## 2022-09-24 DIAGNOSIS — K449 Diaphragmatic hernia without obstruction or gangrene: Secondary | ICD-10-CM | POA: Diagnosis not present

## 2022-09-24 DIAGNOSIS — R109 Unspecified abdominal pain: Secondary | ICD-10-CM | POA: Diagnosis not present

## 2022-09-24 DIAGNOSIS — K635 Polyp of colon: Secondary | ICD-10-CM

## 2022-09-24 DIAGNOSIS — K2100 Gastro-esophageal reflux disease with esophagitis, without bleeding: Secondary | ICD-10-CM

## 2022-09-24 DIAGNOSIS — R1310 Dysphagia, unspecified: Secondary | ICD-10-CM | POA: Diagnosis not present

## 2022-09-24 DIAGNOSIS — D122 Benign neoplasm of ascending colon: Secondary | ICD-10-CM

## 2022-09-24 DIAGNOSIS — K2289 Other specified disease of esophagus: Secondary | ICD-10-CM | POA: Insufficient documentation

## 2022-09-24 DIAGNOSIS — R194 Change in bowel habit: Secondary | ICD-10-CM | POA: Diagnosis not present

## 2022-09-24 DIAGNOSIS — R1319 Other dysphagia: Secondary | ICD-10-CM | POA: Diagnosis present

## 2022-09-24 DIAGNOSIS — K3189 Other diseases of stomach and duodenum: Secondary | ICD-10-CM | POA: Diagnosis not present

## 2022-09-24 DIAGNOSIS — D649 Anemia, unspecified: Secondary | ICD-10-CM | POA: Insufficient documentation

## 2022-09-24 DIAGNOSIS — F419 Anxiety disorder, unspecified: Secondary | ICD-10-CM | POA: Diagnosis not present

## 2022-09-24 DIAGNOSIS — K219 Gastro-esophageal reflux disease without esophagitis: Secondary | ICD-10-CM | POA: Insufficient documentation

## 2022-09-24 HISTORY — PX: BIOPSY: SHX5522

## 2022-09-24 HISTORY — PX: POLYPECTOMY: SHX149

## 2022-09-24 HISTORY — PX: SAVORY DILATION: SHX5439

## 2022-09-24 HISTORY — PX: COLONOSCOPY WITH PROPOFOL: SHX5780

## 2022-09-24 HISTORY — PX: ESOPHAGOGASTRODUODENOSCOPY (EGD) WITH PROPOFOL: SHX5813

## 2022-09-24 HISTORY — PX: HEMOSTASIS CLIP PLACEMENT: SHX6857

## 2022-09-24 LAB — HM COLONOSCOPY

## 2022-09-24 SURGERY — COLONOSCOPY WITH PROPOFOL
Anesthesia: General

## 2022-09-24 MED ORDER — LINACLOTIDE 72 MCG PO CAPS: 72.0000 ug | ORAL_CAPSULE | Freq: Every day | ORAL | 1 refills | Status: DC

## 2022-09-24 MED ORDER — LACTATED RINGERS IV SOLN: INTRAVENOUS | Status: DC

## 2022-09-24 MED ORDER — LACTATED RINGERS IV SOLN: INTRAVENOUS | Status: DC | PRN

## 2022-09-24 MED ORDER — PROPOFOL 10 MG/ML IV BOLUS
INTRAVENOUS | Status: DC | PRN
  Administered 2022-09-24: 70 mg via INTRAVENOUS

## 2022-09-24 MED ORDER — DICYCLOMINE HCL 10 MG PO CAPS: 10.0000 mg | ORAL_CAPSULE | Freq: Two times a day (BID) | ORAL | 1 refills | Status: DC | PRN

## 2022-09-24 MED ORDER — OMEPRAZOLE 40 MG PO CPDR: 40.0000 mg | DELAYED_RELEASE_CAPSULE | Freq: Every day | ORAL | 3 refills | Status: DC

## 2022-09-24 MED ORDER — LIDOCAINE HCL (PF) 2 % IJ SOLN
INTRAMUSCULAR | Status: DC | PRN
  Administered 2022-09-24: 100 mg via INTRADERMAL

## 2022-09-24 MED ORDER — PROPOFOL 500 MG/50ML IV EMUL
INTRAVENOUS | Status: DC | PRN
  Administered 2022-09-24: 150 ug/kg/min via INTRAVENOUS

## 2022-09-24 NOTE — Anesthesia Preprocedure Evaluation (Signed)
Anesthesia Evaluation  Patient identified by MRN, date of birth, ID band Patient awake    Reviewed: Allergy & Precautions, NPO status , Patient's Chart, lab work & pertinent test results  History of Anesthesia Complications Negative for: history of anesthetic complications  Airway Mallampati: II  TM Distance: >3 FB Neck ROM: Full    Dental  (+) Dental Advisory Given, Teeth Intact   Pulmonary neg pulmonary ROS   Pulmonary exam normal breath sounds clear to auscultation       Cardiovascular Exercise Tolerance: Good Normal cardiovascular exam Rhythm:Regular Rate:Normal     Neuro/Psych   Anxiety     negative neurological ROS     GI/Hepatic negative GI ROS, Neg liver ROS,,,  Endo/Other  negative endocrine ROS    Renal/GU negative Renal ROS     Musculoskeletal   Abdominal   Peds  Hematology  (+) Blood dyscrasia, anemia   Anesthesia Other Findings   Reproductive/Obstetrics                             Anesthesia Physical Anesthesia Plan  ASA: II  Anesthesia Plan: General   Post-op Pain Management:    Induction: Intravenous  PONV Risk Score and Plan:   Airway Management Planned: Nasal Cannula and Natural Airway  Additional Equipment:   Intra-op Plan:   Post-operative Plan:   Informed Consent: I have reviewed the patients History and Physical, chart, labs and discussed the procedure including the risks, benefits and alternatives for the proposed anesthesia with the patient or authorized representative who has indicated his/her understanding and acceptance.     Dental advisory given  Plan Discussed with: CRNA  Anesthesia Plan Comments:         Anesthesia Quick Evaluation

## 2022-09-24 NOTE — Op Note (Signed)
[facility] Patient Name: Anne Edwards Procedure Date: 09/24/2022 9:29 AM MRN: [id number] Date of Birth: 1961/08/05 Attending MD: Maylon Peppers , , [id number] CSN: [id number] Age: 61 Admit Type: Outpatient Procedure:                Upper GI endoscopy Indications:              Abdominal pain, Dysphagia Providers:                Maylon Peppers, Crystal Page, Raphael Gibney,                            Technician Referring MD:              Medicines:                Monitored Anesthesia Care Complications:            No immediate complications. Estimated Blood Loss:     Estimated blood loss: none. Procedure:                Pre-Anesthesia Assessment:                           - Prior to the procedure, a History and Physical                            was performed, and patient medications, allergies                            and sensitivities were reviewed. The patient's                            tolerance of previous anesthesia was reviewed.                           - The risks and benefits of the procedure and the                            sedation options and risks were discussed with the                            patient. All questions were answered and informed                            consent was obtained.                           - ASA Grade Assessment: II - A patient with mild                            systemic disease.                           After obtaining informed consent, the endoscope was                            passed under direct vision. Throughout the  procedure, the patient's blood pressure, pulse, and                            oxygen saturations were monitored continuously. The                            [id number]-H190 ([id number]) scope was introduced through the                            mouth, and advanced to the second part of duodenum.                            The upper GI endoscopy was accomplished without                             difficulty. The patient tolerated the procedure                            well. Scope In: 9:41:12 AM Scope Out: 9:53:31 AM Total Procedure Duration: 0 hours 12 minutes 19 seconds  Findings:      A single 4 mm mucosal nodule with a localized distribution was found in       the distal esophagus, 31 cm from the incisors. Biopsies were taken with       a cold forceps for histology.      No endoscopic abnormality was evident in the esophagus to explain the       patient's complaint of dysphagia. It was decided, however, to proceed       with dilation of the entire esophagus. A guidewire was placed and the       scope was withdrawn. Dilation was performed with a Savary dilator with       no resistance at 18 mm.      LA Grade B (one or more mucosal breaks greater than 5 mm, not extending       between the tops of two mucosal folds) esophagitis with no bleeding was       found in the lower third of the esophagus.      A 4 cm hiatal hernia was found. The hiatal narrowing was 36 cm from the       incisors. The Z-line was 32 cm from the incisors. The hernia had an       apparent "twisting", query if related to previous repair.      The entire examined stomach was normal. Biopsies were taken with a cold       forceps for Helicobacter pylori testing.      The examined duodenum was normal. Biopsies were taken with a cold       forceps for histology. Impression:               - Mucosal nodule found in the esophagus. Biopsied.                           - No endoscopic esophageal abnormality to explain                            patient's dysphagia. Esophagus dilated. Dilated.                           -  LA Grade B reflux esophagitis with no bleeding.                           - 4 cm hiatal hernia.                           - Normal stomach. Biopsied.                           - Normal examined duodenum. Biopsied. Moderate Sedation:      Per Anesthesia Care Recommendation:           -  Discharge patient to home (ambulatory).                           - Resume previous diet.                           - Await pathology results.                           - Can take Bentyl as needed for abdominal pain                            episodes.                           - Use Prilosec (omeprazole) 40 mg PO daily. Procedure Code(s):        --- Professional ---                           [phone], Esophagogastroduodenoscopy, flexible,                            transoral; with insertion of guide wire followed by                            passage of dilator(s) through esophagus over guide                            wire                           43239, 64, Esophagogastroduodenoscopy, flexible,                            transoral; with biopsy, single or multiple Diagnosis Code(s):        --- Professional ---                           K22.89, Other specified disease of esophagus                           R13.10, Dysphagia, unspecified                           K21.00, Gastro-esophageal reflux disease with  esophagitis, without bleeding                           K44.9, Diaphragmatic hernia without obstruction or                            gangrene                           R10.9, Unspecified abdominal pain CPT copyright 2022 American Medical Association. All rights reserved. The codes documented in this report are preliminary and upon coder review may  be revised to meet current compliance requirements. Maylon Peppers, MD Maylon Peppers,  09/24/2022 9:59:17 AM This report has been signed electronically. Number of Addenda: 0

## 2022-09-24 NOTE — Discharge Instructions (Addendum)
You are being discharged to home.  Resume your previous diet.  We are waiting for your pathology results.  Can take Bentyl as needed for abdominal pain episodes. Take Prilosec (omeprazole) 40 mg by mouth once a day.  Your physician has recommended a repeat colonoscopy in one year because the bowel preparation was suboptimal.  Start Linzess 72 mcg qday.

## 2022-09-24 NOTE — H&P (Signed)
Anne Edwards is an 61 y.o. female.   Chief Complaint: dysphagia, heartburn, abdominal discomfort and changes in bowel movements.  HPI: Anne Edwards is a 61 y.o. 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   Patient reports that her dysphagia has persisted and has some episodes of heartburn.  States that her periumbilical abdominal pain has become more frequent but intensity is stable.  Last Colonoscopy:(02/04/18)Dr. Cathey, 3 polyps between 5 to 9 mm and 3 cm from anal verge, recommended repeat colonoscopy in 2024. Last Endoscopy:(11/28/20)6cm hh with a few cameron erosions, normal stomach, normal examined duodenum, no specimens collected.   Past Medical History:  Diagnosis Date   Anxiety    Arthritis    Complication of anesthesia    Slow to wake up   History of hiatal hernia    IDA (iron deficiency anemia) 11/24/2018   Pneumonia     Past Surgical History:  Procedure Laterality Date   CHOLECYSTECTOMY     COLONOSCOPY  02/04/2018   Dr. Ladona Horns, 3 polyps between 5-67m and 3 cm from anal verge, repeat colonoscopy in 5 years   ESOPHAGEAL MANOMETRY N/A 01/02/2021   Procedure: ESOPHAGEAL MANOMETRY (EM);  Surgeon: NMauri Pole MD;  Location: [facility];  Service: Endoscopy;  Laterality: N/A;   ESOPHAGOGASTRODUODENOSCOPY (EGD) WITH PROPOFOL N/A 11/28/2020   Castaneda:6cm hh with a few cameron erosions, normal stomach, normal examined duodenum, no specimens collected.   GIVENS CAPSULE STUDY N/A 12/01/2018   Procedure: GIVENS CAPSULE STUDY;  Surgeon: RRogene Houston MD;  [facility];  Service: Endoscopy;  Laterality: N/A;  7:30   JOINT REPLACEMENT Left 07/2020   total L hip   PARTIAL HYSTERECTOMY     XI ROBOTIC ASSISTED HIATAL HERNIA REPAIR N/A 02/11/2021    Procedure: XI ROBOTIC ASSISTED HIATAL HERNIA REPAIR WITH NISSAN FUNDOPLICATION;  Surgeon: SFelicie Morn MD;  Location: [facility];  Service: General;  Laterality: N/A;    History reviewed. No pertinent family history. Social History:  reports that she has never smoked. She has never used smokeless tobacco. She reports that she does not drink alcohol and does not use drugs.  Allergies: No Known Allergies  Medications Prior to Admission  Medication Sig Dispense Refill   acetaminophen (TYLENOL) 650 MG CR tablet Take 1,300 mg by mouth every 8 (eight) hours as needed for pain. Takes as needed     clonazePAM (KLONOPIN) 1 MG tablet Take 1 mg by mouth at bedtime.     cyclobenzaprine (FLEXERIL) 10 MG tablet Take 10 mg by mouth 3 (three) times daily as needed for muscle spasms.     escitalopram (LEXAPRO) 20 MG tablet Take 20 mg by mouth daily.     polyethylene glycol-electrolytes (TRILYTE) 420 g solution Take 4,000 mLs by mouth as directed. 4000 mL 0   ferrous sulfate 324 (65 Fe) MG TBEC Take 324 mg by mouth daily with breakfast.     meloxicam (MOBIC) 7.5 MG tablet Take 7.5 mg by mouth daily.      No results found for this or any previous visit (from the past 48 hour(s)). No results found.  Review of Systems  HENT:  Positive for trouble swallowing.   Gastrointestinal:  Positive for abdominal pain.    Blood pressure (!) 153/72, pulse 88, temperature 97.9 F (  36.6 C), temperature source Oral, resp. rate 20, height '5\' 2"'$  (1.575 m), weight 79.8 kg, SpO2 99 %. Physical Exam  GENERAL: The patient is AO x3, in no acute distress. HEENT: Head is normocephalic and atraumatic. EOMI are intact. Mouth is well hydrated and without lesions. NECK: Supple. No masses LUNGS: Clear to auscultation. No presence of rhonchi/wheezing/rales. Adequate chest expansion HEART: RRR, normal s1 and s2. ABDOMEN: Soft, nontender, no guarding, no peritoneal signs, and nondistended. BS +. No masses. EXTREMITIES: Without  any cyanosis, clubbing, rash, lesions or edema. NEUROLOGIC: AOx3, no focal motor deficit. SKIN: no jaundice, no rashes  Assessment/Plan Statia L Alig is a 61 y.o. 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  Will proceed with EGD and colonoscopy with  Harvel Quale, MD 09/24/2022, 8:30 AM

## 2022-09-24 NOTE — Anesthesia Postprocedure Evaluation (Signed)
Anesthesia Post Note  Patient: Anne Edwards  Procedure(s) Performed: COLONOSCOPY WITH PROPOFOL ESOPHAGOGASTRODUODENOSCOPY (EGD) WITH PROPOFOL BIOPSY SAVORY DILATION POLYPECTOMY INTESTINAL HEMOSTASIS CLIP PLACEMENT  Patient location during evaluation: Endoscopy Anesthesia Type: General Level of consciousness: awake and alert Pain management: pain level controlled Vital Signs Assessment: post-procedure vital signs reviewed and stable Respiratory status: spontaneous breathing, nonlabored ventilation, respiratory function stable and patient connected to nasal cannula oxygen Cardiovascular status: blood pressure returned to baseline and stable Postop Assessment: no apparent nausea or vomiting Anesthetic complications: no   There were no known notable events for this encounter.   Last Vitals:  Vitals:   09/24/22 0742 09/24/22 1040  BP: (!) 153/72 (!) 110/56  Pulse: 88 (!) 110  Resp: 20 15  Temp: 36.6 C (!) 36.3 C  SpO2: 99% 99%    Last Pain:  Vitals:   09/24/22 1040  TempSrc: Axillary  PainSc: 0-No pain                 Trixie Rude

## 2022-09-24 NOTE — Op Note (Signed)
[facility] Patient Name: Anne Edwards Procedure Date: 09/24/2022 9:28 AM MRN: [id number] Date of Birth: 1961/06/24 Attending MD: Maylon Peppers , , [id number] CSN: [id number] Age: 61 Admit Type: Outpatient Procedure:                Colonoscopy Indications:              Change in bowel habits, Incidental abdominal pain                            noted Providers:                Maylon Peppers, Crystal Page, Raphael Gibney,                            Technician Referring MD:              Medicines:                Monitored Anesthesia Care Complications:            No immediate complications. Estimated Blood Loss:     Estimated blood loss: none. Procedure:                Pre-Anesthesia Assessment:                           - Prior to the procedure, a History and Physical                            was performed, and patient medications, allergies                            and sensitivities were reviewed. The patient's                            tolerance of previous anesthesia was reviewed.                           - The risks and benefits of the procedure and the                            sedation options and risks were discussed with the                            patient. All questions were answered and informed                            consent was obtained.                           - ASA Grade Assessment: II - A patient with mild                            systemic disease.                           After obtaining informed consent, the colonoscope  was passed under direct vision. Throughout the                            procedure, the patient's blood pressure, pulse, and                            oxygen saturations were monitored continuously. The                            PCF-HQ190L ([id number]) scope was introduced through                            the anus and advanced to the the cecum, identified                            by  appendiceal orifice and ileocecal valve. The                            colonoscopy was performed without difficulty. The                            patient tolerated the procedure well. The quality                            of the bowel preparation was inadequate. Scope In: 10:00:13 AM Scope Out: 10:33:26 AM Scope Withdrawal Time: 0 hours 22 minutes 27 seconds  Total Procedure Duration: 0 hours 33 minutes 13 seconds  Findings:      The perianal and digital rectal examinations were normal.      A 12 mm polyp was found in the proximal ascending colon. The polyp was       semi-sessile. The polyp was removed with a hot snare. Resection and       retrieval were complete. To prevent bleeding after the polypectomy, one       hemostatic clip was successful and one hemostatic clip was       unsuccessfully placed. Clip manufacturer: [employer]. There was       no bleeding at the end of the procedure.      An 8 mm polyp was found in the mid ascending colon. The polyp was       sessile. The polyp was removed with a cold snare. Resection and       retrieval were complete.      A moderate amount of stool was found in the entire colon, interfering       with visualization. Lavage of the area was performed using a large       amount, resulting in incomplete clearance with fair visualization.      The rest of the examined colon appeared normal. Biopsies for histology       were taken with a cold forceps from the right colon and left colon for       evaluation of microscopic colitis.      The retroflexed view of the distal rectum and anal verge was normal and       showed no anal or rectal abnormalities. Impression:               -  Preparation of the colon was inadequate.                           - One 12 mm polyp in the proximal ascending colon,                            removed with a hot snare. Resected and retrieved.                            Clip was placed. Clip manufacturer: [employer].                           - One 8 mm polyp in the mid ascending colon,                            removed with a cold snare. Resected and retrieved.                           - Stool in the entire examined colon precluding a                            detailed examination.                           - The rest of the examined colon is normal.                            Biopsied.                           - The distal rectum and anal verge are normal on                            retroflexion view. Moderate Sedation:      Per Anesthesia Care Recommendation:           - Discharge patient to home (ambulatory).                           - Resume previous diet.                           - Await pathology results.                           - Repeat colonoscopy in 1 year because the bowel                            preparation was suboptimal.                           - Start Linzess 72 mcg qday. Procedure Code(s):        --- Professional ---  45385, Colonoscopy, flexible; with removal of                            tumor(s), polyp(s), or other lesion(s) by snare                            technique                           45380, 59, Colonoscopy, flexible; with biopsy,                            single or multiple Diagnosis Code(s):        --- Professional ---                           D12.2, Benign neoplasm of ascending colon                           R19.4, Change in bowel habit CPT copyright 2022 American Medical Association. All rights reserved. The codes documented in this report are preliminary and upon coder review may  be revised to meet current compliance requirements. Maylon Peppers, MD Maylon Peppers,  09/24/2022 10:43:21 AM This report has been signed electronically. Number of Addenda: 0

## 2022-09-24 NOTE — Transfer of Care (Signed)
Immediate Anesthesia Transfer of Care Note  Patient: Charmin L Pott  Procedure(s) Performed: COLONOSCOPY WITH PROPOFOL ESOPHAGOGASTRODUODENOSCOPY (EGD) WITH PROPOFOL BIOPSY SAVORY DILATION POLYPECTOMY INTESTINAL HEMOSTASIS CLIP PLACEMENT  Patient Location: PACU  Anesthesia Type:General  Level of Consciousness: awake, alert , oriented, and patient cooperative  Airway & Oxygen Therapy: Patient Spontanous Breathing  Post-op Assessment: Report given to RN, Post -op Vital signs reviewed and stable, and Patient moving all extremities X 4  Post vital signs: Reviewed and stable  Last Vitals:  Vitals Value Taken Time  BP 110/56 09/24/22 1040  Temp 36.3 C 09/24/22 1040  Pulse 110 09/24/22 1040  Resp 15 09/24/22 1040  SpO2 99 % 09/24/22 1040    Last Pain:  Vitals:   09/24/22 1040  TempSrc: Axillary  PainSc: 0-No pain      Patients Stated Pain Goal: 10 (28/90/22 8406)  Complications: No notable events documented.

## 2022-09-25 ENCOUNTER — Encounter (INDEPENDENT_AMBULATORY_CARE_PROVIDER_SITE_OTHER): Payer: Self-pay | Admitting: *Deleted

## 2022-09-25 LAB — SURGICAL PATHOLOGY

## 2022-09-30 ENCOUNTER — Encounter (HOSPITAL_COMMUNITY): Payer: Self-pay | Admitting: Gastroenterology

## 2022-10-03 DIAGNOSIS — Z6834 Body mass index (BMI) 34.0-34.9, adult: Secondary | ICD-10-CM | POA: Diagnosis not present

## 2022-10-03 DIAGNOSIS — D509 Iron deficiency anemia, unspecified: Secondary | ICD-10-CM | POA: Diagnosis not present

## 2022-10-03 DIAGNOSIS — Z20828 Contact with and (suspected) exposure to other viral communicable diseases: Secondary | ICD-10-CM | POA: Diagnosis not present

## 2022-10-03 DIAGNOSIS — R030 Elevated blood-pressure reading, without diagnosis of hypertension: Secondary | ICD-10-CM | POA: Diagnosis not present

## 2022-10-28 DIAGNOSIS — N10 Acute tubulo-interstitial nephritis: Secondary | ICD-10-CM | POA: Diagnosis not present

## 2022-10-28 DIAGNOSIS — N3001 Acute cystitis with hematuria: Secondary | ICD-10-CM | POA: Diagnosis not present

## 2022-10-28 DIAGNOSIS — R030 Elevated blood-pressure reading, without diagnosis of hypertension: Secondary | ICD-10-CM | POA: Diagnosis not present

## 2022-10-31 DIAGNOSIS — R319 Hematuria, unspecified: Secondary | ICD-10-CM | POA: Diagnosis not present

## 2022-10-31 DIAGNOSIS — R300 Dysuria: Secondary | ICD-10-CM | POA: Diagnosis not present

## 2022-10-31 DIAGNOSIS — N200 Calculus of kidney: Secondary | ICD-10-CM | POA: Diagnosis not present

## 2022-10-31 DIAGNOSIS — K449 Diaphragmatic hernia without obstruction or gangrene: Secondary | ICD-10-CM | POA: Diagnosis not present

## 2022-10-31 DIAGNOSIS — I700 Atherosclerosis of aorta: Secondary | ICD-10-CM | POA: Diagnosis not present

## 2022-11-10 ENCOUNTER — Ambulatory Visit (INDEPENDENT_AMBULATORY_CARE_PROVIDER_SITE_OTHER): Payer: Self-pay | Admitting: Gastroenterology

## 2022-11-10 DIAGNOSIS — N200 Calculus of kidney: Secondary | ICD-10-CM | POA: Diagnosis not present

## 2022-11-10 DIAGNOSIS — R350 Frequency of micturition: Secondary | ICD-10-CM | POA: Diagnosis not present

## 2022-11-10 DIAGNOSIS — R3915 Urgency of urination: Secondary | ICD-10-CM | POA: Diagnosis not present

## 2022-11-10 DIAGNOSIS — N3000 Acute cystitis without hematuria: Secondary | ICD-10-CM | POA: Diagnosis not present

## 2022-11-11 ENCOUNTER — Other Ambulatory Visit: Payer: Self-pay | Admitting: Urology

## 2022-11-11 ENCOUNTER — Encounter (HOSPITAL_BASED_OUTPATIENT_CLINIC_OR_DEPARTMENT_OTHER): Payer: Self-pay | Admitting: Urology

## 2022-11-11 NOTE — Progress Notes (Signed)
Left message to call back and to stop Mobic on Wed. By 0800

## 2022-11-11 NOTE — H&P (Signed)
62 year old female sent by Tawni Carnes, PA for evaluation and management of a left renal calculus, recurrent urinary tract infections and lower urinary tract symptoms.   She denies febrile UTIs, but she says that she has frequent urinary tract infections. Symptoms include frequency, urgency, dysuria. She has had 1 episode of gross hematuria. She feels like she has back pain with these infections but she has scoliosis and basically has back pain every day.   She had recent CT scan that showed a 6 mm left interpolar renal calculus. No hydronephrosis. Prior CT in April 2023 revealed only a 2 mm left lower pole stone.   She she has, on a normal basis, frequency, urgency as well as urgency incontinence. She does wear pads.     ALLERGIES: No Allergies    MEDICATIONS: Clonazepam 1 mg tablet 1 tablet PO BID  Cyclobenzaprine Hcl 10 mg tablet 1 tablet PO Daily PRN  Dicyclomine Hcl 10 mg capsule 1 capsule PO Daily  Escitalopram Oxalate 20 mg tablet 1 tablet PO Daily  Linzess 72 mcg capsule 1 capsule PO Daily  Meloxicam 15 mg tablet 1 tablet PO Daily  Omeprazole 40 mg capsule,delayed release 1 capsule PO Daily     GU PSH: No GU PSH    NON-GU PSH: No Non-GU PSH    GU PMH: None   NON-GU PMH: Anemia, unspecified Anxiety Arthritis GERD    FAMILY HISTORY: Blood In Urine - Runs in Family chronic kidney disease - Mother father deceased - Father Kidney Stones - Runs in Family mother deceased - Mother   SOCIAL HISTORY: Marital Status: Married Ethnicity: Not Hispanic Or Latino; Race: White Current Smoking Status: Patient has never smoked.   Tobacco Use Assessment Completed: Used Tobacco in last 30 days? Has never drank.  Does not use drugs. Drinks 2 caffeinated drinks per day.    REVIEW OF SYSTEMS:    GU Review Female:   blood in urine and uti's.  Patient reports frequent urination, hard to postpone urination, burning /pain with urination, get up at night to urinate, and leakage of  urine. Patient denies stream starts and stops, trouble starting your stream, have to strain to urinate, and being pregnant.  Gastrointestinal (Upper):   Patient reports nausea and indigestion/ heartburn. Patient denies vomiting.  Gastrointestinal (Lower):   Patient reports diarrhea. Patient denies constipation.  Constitutional:   Patient reports night sweats and fatigue. Patient denies fever and weight loss.  Skin:   Patient reports itching. Patient denies skin rash/ lesion.  Eyes:   Patient denies blurred vision and double vision.  Ears/ Nose/ Throat:   Patient denies sore throat and sinus problems.  Hematologic/Lymphatic:   Patient denies swollen glands and easy bruising.  Cardiovascular:   Patient denies leg swelling and chest pains.  Respiratory:   Patient denies cough and shortness of breath.  Endocrine:   Patient denies excessive thirst.  Musculoskeletal:   Patient reports back pain and joint pain.   Neurological:   Patient reports headaches. Patient denies dizziness.  Psychologic:   Patient reports anxiety. Patient denies depression.   VITAL SIGNS:      11/10/2022 01:44 PM  BP 173/83 mmHg  Pulse 99 /min  Temperature 97.5 F / 36.3 C   MULTI-SYSTEM PHYSICAL EXAMINATION:    Constitutional: Well-nourished. No physical deformities. Normally developed. Good grooming.  Neck: Neck symmetrical, not swollen. Normal tracheal position.  Respiratory: No labored breathing, no use of accessory muscles.   Skin: No paleness, no jaundice, no cyanosis. No lesion,  no ulcer, no rash.  Neurologic / Psychiatric: Oriented to time, oriented to place, oriented to person. No depression, no anxiety, no agitation.  Eyes: Normal conjunctivae. Normal eyelids.  Ears, Nose, Mouth, and Throat: Left ear no scars, no lesions, no masses. Right ear no scars, no lesions, no masses. Nose no scars, no lesions, no masses. Normal hearing. Normal lips.  Musculoskeletal: Normal gait and station of head and neck.      Complexity of Data:  Source Of History:  Patient  Records Review:   Previous Doctor Records  Urine Test Review:   Urinalysis, Urine Culture  X-Ray Review: [facility] Protocol: Reviewed Films. Reviewed Report. Discussed With Patient. FINDINGS:  Evaluation of this exam is limited in the absence of intravenous  contrast.   Lower chest: Right lung base scarring similar to prior CT. A 4 mm  subpleural nodule in the right middle lobe also similar to prior CT.  There is mild elevation of the left hemidiaphragm.   No intra-abdominal free air or free fluid.   Hepatobiliary: A 12 mm hypodense lesion in the left lobe of the  liver demonstrates fluid attenuation most consistent with a cyst. No  biliary ductal dilatation. Cholecystectomy.   Pancreas: Unremarkable. No pancreatic ductal dilatation or  surrounding inflammatory changes.   Spleen: Normal in size without focal abnormality.   Adrenals/Urinary Tract: The adrenal glands are unremarkable. There  is a 6 mm stone in the left renal interpolar collecting system.  Additional punctate nonobstructing stones noted in the lower pole of  the kidneys bilaterally. There is no hydronephrosis on either side.  Indeterminate 1 cm exophytic lesion from the posterior interpolar  right kidney not characterized on this noncontrast CT but present on  the prior CT likely a cyst. This can be better evaluated with  ultrasound. The visualized ureters and the urinary bladder appear  unremarkable.   Stomach/Bowel: There is moderate stool throughout the colon. There  is a small hiatal hernia. There is no bowel obstruction or active  inflammation. The appendix is normal.   Vascular/Lymphatic: Mild aortoiliac atherosclerotic disease. The IVC  is unremarkable. No portal venous gas. There is no adenopathy.   Reproductive: Hysterectomy. No adnexal masses.   Other: None   Musculoskeletal: Total left hip arthroplasty. Degenerative changes  of the spine and  scoliosis. No acute osseous pathology.   IMPRESSION:  1. Nonobstructing bilateral renal calculi. No hydronephrosis.  2. No bowel obstruction. Normal appendix.  3. Small hiatal hernia.  4. Aortic Atherosclerosis (ICD10-I70.0).      PROCEDURES:         KUB - [id number]  A single view of the abdomen is obtained.      Patient confirmed No Neulasta OnPro Device.          PVR Ultrasound - [id number]  Scanned Volume: 38 cc         Urinalysis w/Scope Dipstick Dipstick Cont'd Micro  Color: Yellow Bilirubin: Neg mg/dL WBC/hpf: 10 - 20/hpf  Appearance: Cloudy Ketones: Neg mg/dL RBC/hpf: 0 - 2/hpf  Specific Gravity: 1.015 Blood: Neg ery/uL Bacteria: Many (>50/hpf)  pH: 6.5 Protein: Trace mg/dL Cystals: NS (Not Seen)  Glucose: Neg mg/dL Urobilinogen: 0.2 mg/dL Casts: NS (Not Seen)    Nitrites: Positive Trichomonas: Not Present    Leukocyte Esterase: 2+ leu/uL Mucous: Not Present      Epithelial Cells: 0 - 5/hpf      Yeast: NS (Not Seen)      Sperm: Not Present    Notes: too  numerous to count    ASSESSMENT:      ICD-10 Details  1 GU:   Acute Cystitis/UTI - N30.00   2   Renal calculus - N20.0   3   Postmenopausal atrophic vaginitis - N95.2   4   Urinary Frequency - R35.0   5   Urinary Urgency - R39.15    PLAN:            Medications New Meds: Estrace 0.01 % cream with applicator Apply 1 g per vagina 2 nights a week   #45  3 Refill(s)  Estrace 0.01 % cream with applicator Apply 1/2 inch in applicator per vagina 2 nights a week   #45  3 Refill(s)  Macrobid 100 mg capsule 1 capsule PO Q 12 H   #14  0 Refill(s)  Tramadol Hcl 50 mg tablet 1 tablet PO Every 6 hours as needed pain   #15  0 Refill(s)  Pharmacy Name:  [id number]  Address:  [address]   [place], [place] [address]  Phone:  [phone]  Fax:  [phone]            Orders Labs Urine Culture  X-Rays: KUB          Schedule         Document Letter(s):  Created for Patient: Clinical Summary          Notes:   1. I would recommend that she proceed with treatment of the one 1 stone in her left renal pelvis that she is symptomatic from   2. I would recommend shockwave lithotripsy over ureteroscopy-risks and complications discussed   3. Overactive bladder guide sheet as well as chronic UTI guide sheet given   4. I did put her on 1 week of Macrobid following urine culture   5. Estrace cream sent in to use a small amount 2 nights a week, take cranberry capsules   6. We will work on setting up shockwave lithotripsy.   CC: Tawni Carnes, [place]

## 2022-11-12 NOTE — Progress Notes (Signed)
Left message requesting call back regarding upcoming ESWL

## 2022-11-13 NOTE — Progress Notes (Signed)
Talked with patient. Instructions given. Arrival time 0800. NPO after MN. Husband is the driver. Stopped Mobic  before wed.

## 2022-11-14 ENCOUNTER — Ambulatory Visit (HOSPITAL_COMMUNITY): Payer: Medicare HMO

## 2022-11-14 ENCOUNTER — Encounter (HOSPITAL_BASED_OUTPATIENT_CLINIC_OR_DEPARTMENT_OTHER): Payer: Self-pay | Admitting: Urology

## 2022-11-14 ENCOUNTER — Other Ambulatory Visit: Payer: Self-pay

## 2022-11-14 ENCOUNTER — Encounter (HOSPITAL_BASED_OUTPATIENT_CLINIC_OR_DEPARTMENT_OTHER)
Admission: RE | Disposition: A | Discharge status: Home / Self Care | Payer: Self-pay | Source: Home / Self Care | Attending: Urology

## 2022-11-14 ENCOUNTER — Ambulatory Visit (HOSPITAL_BASED_OUTPATIENT_CLINIC_OR_DEPARTMENT_OTHER)
Admission: RE | Admit: 2022-11-14 | Discharge: 2022-11-14 | Disposition: A | Discharge status: Home / Self Care | Payer: Medicare HMO | Attending: Urology | Admitting: Urology

## 2022-11-14 DIAGNOSIS — K449 Diaphragmatic hernia without obstruction or gangrene: Secondary | ICD-10-CM | POA: Diagnosis not present

## 2022-11-14 DIAGNOSIS — N200 Calculus of kidney: Secondary | ICD-10-CM | POA: Insufficient documentation

## 2022-11-14 DIAGNOSIS — I700 Atherosclerosis of aorta: Secondary | ICD-10-CM | POA: Diagnosis not present

## 2022-11-14 DIAGNOSIS — N952 Postmenopausal atrophic vaginitis: Secondary | ICD-10-CM | POA: Insufficient documentation

## 2022-11-14 DIAGNOSIS — N3000 Acute cystitis without hematuria: Secondary | ICD-10-CM | POA: Diagnosis not present

## 2022-11-14 DIAGNOSIS — Z87442 Personal history of urinary calculi: Secondary | ICD-10-CM | POA: Diagnosis not present

## 2022-11-14 DIAGNOSIS — N201 Calculus of ureter: Secondary | ICD-10-CM | POA: Diagnosis not present

## 2022-11-14 HISTORY — PX: EXTRACORPOREAL SHOCK WAVE LITHOTRIPSY: SHX1557

## 2022-11-14 SURGERY — LITHOTRIPSY, ESWL
Anesthesia: LOCAL | Laterality: Left

## 2022-11-14 MED ORDER — DIAZEPAM 5 MG PO TABS
ORAL_TABLET | ORAL | Status: AC
  Filled 2022-11-14: qty 2

## 2022-11-14 MED ORDER — CIPROFLOXACIN HCL 500 MG PO TABS
ORAL_TABLET | ORAL | Status: AC
  Filled 2022-11-14: qty 1

## 2022-11-14 MED ORDER — DIAZEPAM 5 MG PO TABS
10.0000 mg | ORAL_TABLET | ORAL | Status: AC
  Administered 2022-11-14: 10 mg via ORAL

## 2022-11-14 MED ORDER — SODIUM CHLORIDE 0.9 % IV SOLN: INTRAVENOUS | Status: DC

## 2022-11-14 MED ORDER — DIPHENHYDRAMINE HCL 25 MG PO CAPS
ORAL_CAPSULE | ORAL | Status: AC
  Filled 2022-11-14: qty 1

## 2022-11-14 MED ORDER — CIPROFLOXACIN HCL 500 MG PO TABS
500.0000 mg | ORAL_TABLET | ORAL | Status: AC
  Administered 2022-11-14: 500 mg via ORAL

## 2022-11-14 MED ORDER — DIPHENHYDRAMINE HCL 25 MG PO CAPS
25.0000 mg | ORAL_CAPSULE | ORAL | Status: AC
  Administered 2022-11-14: 25 mg via ORAL

## 2022-11-14 NOTE — Interval H&P Note (Signed)
History and Physical Interval Note:  11/14/2022 8:31 AM  Anne Edwards  has presented today for surgery, with the diagnosis of RENAL CALCULUS.  The various methods of treatment have been discussed with the patient and family. After consideration of risks, benefits and other options for treatment, the patient has consented to  Procedure(s): EXTRACORPOREAL SHOCK WAVE LITHOTRIPSY (ESWL) (Left) as a surgical intervention.  The patient's history has been reviewed, patient examined, no change in status, stable for surgery.  I have reviewed the patient's chart and labs.  Questions were answered to the patient's satisfaction.     Monserat Prestigiacomo A Virgel Haro

## 2022-11-14 NOTE — Discharge Instructions (Signed)
I have reviewed discharge instructions in detail with the patient. They will follow-up with me or their physician as scheduled. My nurse will also be calling the patients as per protocol.   

## 2022-11-17 ENCOUNTER — Encounter (HOSPITAL_BASED_OUTPATIENT_CLINIC_OR_DEPARTMENT_OTHER): Payer: Self-pay | Admitting: Urology

## 2022-11-26 DIAGNOSIS — E7849 Other hyperlipidemia: Secondary | ICD-10-CM | POA: Diagnosis not present

## 2022-11-26 DIAGNOSIS — D649 Anemia, unspecified: Secondary | ICD-10-CM | POA: Diagnosis not present

## 2022-11-26 DIAGNOSIS — E559 Vitamin D deficiency, unspecified: Secondary | ICD-10-CM | POA: Diagnosis not present

## 2022-11-26 DIAGNOSIS — R5383 Other fatigue: Secondary | ICD-10-CM | POA: Diagnosis not present

## 2022-11-26 DIAGNOSIS — E039 Hypothyroidism, unspecified: Secondary | ICD-10-CM | POA: Diagnosis not present

## 2022-11-26 DIAGNOSIS — K219 Gastro-esophageal reflux disease without esophagitis: Secondary | ICD-10-CM | POA: Diagnosis not present

## 2022-11-26 DIAGNOSIS — D529 Folate deficiency anemia, unspecified: Secondary | ICD-10-CM | POA: Diagnosis not present

## 2022-11-28 DIAGNOSIS — R8271 Bacteriuria: Secondary | ICD-10-CM | POA: Diagnosis not present

## 2022-11-28 DIAGNOSIS — N200 Calculus of kidney: Secondary | ICD-10-CM | POA: Diagnosis not present

## 2022-11-28 DIAGNOSIS — N3000 Acute cystitis without hematuria: Secondary | ICD-10-CM | POA: Diagnosis not present

## 2022-12-02 DIAGNOSIS — Z6832 Body mass index (BMI) 32.0-32.9, adult: Secondary | ICD-10-CM | POA: Diagnosis not present

## 2022-12-02 DIAGNOSIS — D649 Anemia, unspecified: Secondary | ICD-10-CM | POA: Diagnosis not present

## 2022-12-02 DIAGNOSIS — G2581 Restless legs syndrome: Secondary | ICD-10-CM | POA: Diagnosis not present

## 2022-12-02 DIAGNOSIS — S39012A Strain of muscle, fascia and tendon of lower back, initial encounter: Secondary | ICD-10-CM | POA: Diagnosis not present

## 2022-12-02 DIAGNOSIS — N200 Calculus of kidney: Secondary | ICD-10-CM | POA: Diagnosis not present

## 2022-12-02 DIAGNOSIS — M419 Scoliosis, unspecified: Secondary | ICD-10-CM | POA: Diagnosis not present

## 2022-12-02 DIAGNOSIS — E039 Hypothyroidism, unspecified: Secondary | ICD-10-CM | POA: Diagnosis not present

## 2022-12-02 DIAGNOSIS — F419 Anxiety disorder, unspecified: Secondary | ICD-10-CM | POA: Diagnosis not present

## 2022-12-02 DIAGNOSIS — N951 Menopausal and female climacteric states: Secondary | ICD-10-CM | POA: Diagnosis not present

## 2022-12-19 DIAGNOSIS — M79645 Pain in left finger(s): Secondary | ICD-10-CM | POA: Diagnosis not present

## 2022-12-24 DIAGNOSIS — M1812 Unilateral primary osteoarthritis of first carpometacarpal joint, left hand: Secondary | ICD-10-CM | POA: Diagnosis not present

## 2022-12-24 DIAGNOSIS — M1711 Unilateral primary osteoarthritis, right knee: Secondary | ICD-10-CM | POA: Diagnosis not present

## 2023-01-19 DIAGNOSIS — M1711 Unilateral primary osteoarthritis, right knee: Secondary | ICD-10-CM | POA: Diagnosis not present

## 2023-01-20 DIAGNOSIS — Z01812 Encounter for preprocedural laboratory examination: Secondary | ICD-10-CM | POA: Diagnosis not present

## 2023-01-20 DIAGNOSIS — M25561 Pain in right knee: Secondary | ICD-10-CM | POA: Diagnosis not present

## 2023-01-20 DIAGNOSIS — M1711 Unilateral primary osteoarthritis, right knee: Secondary | ICD-10-CM | POA: Diagnosis not present

## 2023-01-21 ENCOUNTER — Encounter (HOSPITAL_COMMUNITY): Admission: RE | Admit: 2023-01-21 | Payer: Medicare HMO | Source: Ambulatory Visit

## 2023-01-22 DIAGNOSIS — E039 Hypothyroidism, unspecified: Secondary | ICD-10-CM | POA: Diagnosis not present

## 2023-01-22 DIAGNOSIS — Z1231 Encounter for screening mammogram for malignant neoplasm of breast: Secondary | ICD-10-CM | POA: Diagnosis not present

## 2023-01-27 ENCOUNTER — Ambulatory Visit: Admit: 2023-01-27 | Payer: Medicare HMO | Admitting: Orthopedic Surgery

## 2023-01-27 DIAGNOSIS — G8918 Other acute postprocedural pain: Secondary | ICD-10-CM | POA: Diagnosis not present

## 2023-01-27 DIAGNOSIS — M1711 Unilateral primary osteoarthritis, right knee: Secondary | ICD-10-CM | POA: Diagnosis not present

## 2023-01-27 SURGERY — ARTHROPLASTY, KNEE, UNICOMPARTMENTAL
Anesthesia: Choice | Site: Knee | Laterality: Right

## 2023-02-06 DIAGNOSIS — M25561 Pain in right knee: Secondary | ICD-10-CM | POA: Diagnosis not present

## 2023-02-06 DIAGNOSIS — Z471 Aftercare following joint replacement surgery: Secondary | ICD-10-CM | POA: Diagnosis not present

## 2023-02-06 DIAGNOSIS — M6281 Muscle weakness (generalized): Secondary | ICD-10-CM | POA: Diagnosis not present

## 2023-02-09 DIAGNOSIS — M1711 Unilateral primary osteoarthritis, right knee: Secondary | ICD-10-CM | POA: Diagnosis not present

## 2023-02-10 DIAGNOSIS — M25561 Pain in right knee: Secondary | ICD-10-CM | POA: Diagnosis not present

## 2023-02-10 DIAGNOSIS — Z471 Aftercare following joint replacement surgery: Secondary | ICD-10-CM | POA: Diagnosis not present

## 2023-02-13 DIAGNOSIS — M6281 Muscle weakness (generalized): Secondary | ICD-10-CM | POA: Diagnosis not present

## 2023-02-13 DIAGNOSIS — M25561 Pain in right knee: Secondary | ICD-10-CM | POA: Diagnosis not present

## 2023-02-13 DIAGNOSIS — Z471 Aftercare following joint replacement surgery: Secondary | ICD-10-CM | POA: Diagnosis not present

## 2023-02-23 DIAGNOSIS — M1711 Unilateral primary osteoarthritis, right knee: Secondary | ICD-10-CM | POA: Diagnosis not present

## 2023-02-26 DIAGNOSIS — M6281 Muscle weakness (generalized): Secondary | ICD-10-CM | POA: Diagnosis not present

## 2023-02-26 DIAGNOSIS — Z471 Aftercare following joint replacement surgery: Secondary | ICD-10-CM | POA: Diagnosis not present

## 2023-02-26 DIAGNOSIS — M25561 Pain in right knee: Secondary | ICD-10-CM | POA: Diagnosis not present

## 2023-03-03 DIAGNOSIS — M25561 Pain in right knee: Secondary | ICD-10-CM | POA: Diagnosis not present

## 2023-03-03 DIAGNOSIS — M6281 Muscle weakness (generalized): Secondary | ICD-10-CM | POA: Diagnosis not present

## 2023-03-03 DIAGNOSIS — Z471 Aftercare following joint replacement surgery: Secondary | ICD-10-CM | POA: Diagnosis not present

## 2023-03-04 DIAGNOSIS — N200 Calculus of kidney: Secondary | ICD-10-CM | POA: Diagnosis not present

## 2023-03-04 DIAGNOSIS — N3020 Other chronic cystitis without hematuria: Secondary | ICD-10-CM | POA: Diagnosis not present

## 2023-03-04 DIAGNOSIS — R8271 Bacteriuria: Secondary | ICD-10-CM | POA: Diagnosis not present

## 2023-03-06 DIAGNOSIS — E039 Hypothyroidism, unspecified: Secondary | ICD-10-CM | POA: Diagnosis not present

## 2023-03-06 DIAGNOSIS — D649 Anemia, unspecified: Secondary | ICD-10-CM | POA: Diagnosis not present

## 2023-03-06 DIAGNOSIS — N951 Menopausal and female climacteric states: Secondary | ICD-10-CM | POA: Diagnosis not present

## 2023-03-06 DIAGNOSIS — G2581 Restless legs syndrome: Secondary | ICD-10-CM | POA: Diagnosis not present

## 2023-03-06 DIAGNOSIS — Z6832 Body mass index (BMI) 32.0-32.9, adult: Secondary | ICD-10-CM | POA: Diagnosis not present

## 2023-03-06 DIAGNOSIS — M419 Scoliosis, unspecified: Secondary | ICD-10-CM | POA: Diagnosis not present

## 2023-03-06 DIAGNOSIS — R5383 Other fatigue: Secondary | ICD-10-CM | POA: Diagnosis not present

## 2023-03-06 DIAGNOSIS — M6281 Muscle weakness (generalized): Secondary | ICD-10-CM | POA: Diagnosis not present

## 2023-03-06 DIAGNOSIS — F419 Anxiety disorder, unspecified: Secondary | ICD-10-CM | POA: Diagnosis not present

## 2023-03-06 DIAGNOSIS — M545 Low back pain, unspecified: Secondary | ICD-10-CM | POA: Diagnosis not present

## 2023-03-06 DIAGNOSIS — M25561 Pain in right knee: Secondary | ICD-10-CM | POA: Diagnosis not present

## 2023-03-06 DIAGNOSIS — Z471 Aftercare following joint replacement surgery: Secondary | ICD-10-CM | POA: Diagnosis not present

## 2023-03-06 DIAGNOSIS — N200 Calculus of kidney: Secondary | ICD-10-CM | POA: Diagnosis not present

## 2023-03-11 DIAGNOSIS — M1711 Unilateral primary osteoarthritis, right knee: Secondary | ICD-10-CM | POA: Diagnosis not present

## 2023-04-08 DIAGNOSIS — M1711 Unilateral primary osteoarthritis, right knee: Secondary | ICD-10-CM | POA: Diagnosis not present

## 2023-04-24 DIAGNOSIS — M1711 Unilateral primary osteoarthritis, right knee: Secondary | ICD-10-CM | POA: Diagnosis not present

## 2023-04-24 DIAGNOSIS — M25552 Pain in left hip: Secondary | ICD-10-CM | POA: Diagnosis not present

## 2023-05-27 DIAGNOSIS — Z6832 Body mass index (BMI) 32.0-32.9, adult: Secondary | ICD-10-CM | POA: Diagnosis not present

## 2023-05-27 DIAGNOSIS — R059 Cough, unspecified: Secondary | ICD-10-CM | POA: Diagnosis not present

## 2023-05-27 DIAGNOSIS — Z20828 Contact with and (suspected) exposure to other viral communicable diseases: Secondary | ICD-10-CM | POA: Diagnosis not present

## 2023-05-27 DIAGNOSIS — R030 Elevated blood-pressure reading, without diagnosis of hypertension: Secondary | ICD-10-CM | POA: Diagnosis not present

## 2023-05-27 DIAGNOSIS — J0101 Acute recurrent maxillary sinusitis: Secondary | ICD-10-CM | POA: Diagnosis not present

## 2023-07-02 DIAGNOSIS — M545 Low back pain, unspecified: Secondary | ICD-10-CM | POA: Diagnosis not present

## 2023-07-02 DIAGNOSIS — R635 Abnormal weight gain: Secondary | ICD-10-CM | POA: Diagnosis not present

## 2023-07-02 DIAGNOSIS — R5383 Other fatigue: Secondary | ICD-10-CM | POA: Diagnosis not present

## 2023-07-02 DIAGNOSIS — Z6832 Body mass index (BMI) 32.0-32.9, adult: Secondary | ICD-10-CM | POA: Diagnosis not present

## 2023-07-02 DIAGNOSIS — R030 Elevated blood-pressure reading, without diagnosis of hypertension: Secondary | ICD-10-CM | POA: Diagnosis not present

## 2023-07-02 DIAGNOSIS — M2550 Pain in unspecified joint: Secondary | ICD-10-CM | POA: Diagnosis not present

## 2023-07-13 DIAGNOSIS — M25561 Pain in right knee: Secondary | ICD-10-CM | POA: Diagnosis not present

## 2023-07-13 DIAGNOSIS — M25552 Pain in left hip: Secondary | ICD-10-CM | POA: Diagnosis not present

## 2023-08-27 ENCOUNTER — Ambulatory Visit (INDEPENDENT_AMBULATORY_CARE_PROVIDER_SITE_OTHER): Payer: Medicare HMO | Admitting: Gastroenterology

## 2023-09-01 ENCOUNTER — Telehealth (INDEPENDENT_AMBULATORY_CARE_PROVIDER_SITE_OTHER): Payer: Self-pay | Admitting: Gastroenterology

## 2023-09-01 ENCOUNTER — Ambulatory Visit (INDEPENDENT_AMBULATORY_CARE_PROVIDER_SITE_OTHER): Payer: Medicare HMO | Admitting: Gastroenterology

## 2023-09-01 ENCOUNTER — Encounter (INDEPENDENT_AMBULATORY_CARE_PROVIDER_SITE_OTHER): Payer: Self-pay | Admitting: Gastroenterology

## 2023-09-01 VITALS — BP 135/69 | HR 87 | Temp 98.1°F | Ht 62.0 in | Wt 180.9 lb

## 2023-09-01 DIAGNOSIS — R1319 Other dysphagia: Secondary | ICD-10-CM

## 2023-09-01 DIAGNOSIS — R1310 Dysphagia, unspecified: Secondary | ICD-10-CM | POA: Diagnosis not present

## 2023-09-01 DIAGNOSIS — K219 Gastro-esophageal reflux disease without esophagitis: Secondary | ICD-10-CM | POA: Diagnosis not present

## 2023-09-01 DIAGNOSIS — R159 Full incontinence of feces: Secondary | ICD-10-CM | POA: Diagnosis not present

## 2023-09-01 MED ORDER — PANTOPRAZOLE SODIUM 40 MG PO TBEC: 40.0000 mg | DELAYED_RELEASE_TABLET | Freq: Two times a day (BID) | ORAL | 2 refills | Status: DC

## 2023-09-01 NOTE — Patient Instructions (Addendum)
I have sent protonix 40mg  to your pharmacy Please take this 30 minutes prior to breakfast and 30 minutes prior to dinner  Avoid greasy, spicy, fried, citrus foods, and be mindful that caffeine, carbonated drinks, chocolate and alcohol can increase reflux symptoms Stay upright 2-3 hours after eating, prior to lying down and avoid eating late in the evenings.  We will get you scheduled for repeat Colonoscopy as prep last year was not good  Increase water intake, aim for atleast 64 oz per day Increase fruits, veggies and whole grains, kiwi and prunes are especially good for constipation Please start miralax 1 capful twice daily x 1 week, then start benefiber 1T twice daily with a meal   We will schedule you for a barium pill esophagram to further evaluate your swallowing  Follow up 3-4 months  It was a pleasure to see you today. I want to create trusting relationships with patients and provide genuine, compassionate, and quality care. I truly value your feedback! please be on the lookout for a survey regarding your visit with me today. I appreciate your input about our visit and your time in completing this!    Anne Edwards L. Jeanmarie Hubert, MSN, APRN, AGNP-C Adult-Gerontology Nurse Practitioner [facility]

## 2023-09-01 NOTE — Telephone Encounter (Signed)
Pt returned call. Only day available is 10/08/23 and pt does not want to be on clear liquids on Christmas. Advised pt I could call her with January schedule. Pt verbalized understanding.

## 2023-09-01 NOTE — Progress Notes (Signed)
Referring Provider: Royann Shivers, * Primary Care Physician:  Royann Shivers, PA-C Primary GI Physician: Dr. Levon Hedger   Chief Complaint  Patient presents with   Gastroesophageal Reflux    Follow up on GERD. States reflux is pretty bad. Burning in throat and feels like food gets hung. Having nausea. Tried omeprazole and it did not help. Taking tums and pepto as needed.    Encopresis    Having stool incontinence. Has lower back pain when this happens.    HPI:   Anne Edwards is a 62 y.o. 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  Having occasional episodes of regurgitation.  Taking omeprazole 40 mg as needed.  Reported chronic history of constipation that last 3 to 4 days.  Intermittent episodes of diarrhea.  Patient recommended to schedule EGD and colonoscopy, consider GES if EGD negative, consider Xifaxan trial depending on endoscopic findings  Present: Patient states she is not taking PPI right now, omeprazole was not working. She is taking pepto/tums PRN but reports this is quite often needed. She feels that foods often stop at sternal notch. This can happen with any food. She has to make sure she chews very well. She has to drink a lot of liquids and usually can get the food to pass. She notes a lot of acid regurgitation and heartburn which sometimes makes her nauseated. Has not been on any other PPI therapies.  She notes she is having some fecal incontinence, stools can be loose, at times they are more formed. She notes she has to wear a pad due to accidents. She feels she often cannot control her bowels and sometimes does not know when she has to defecate. She can go up to a week without a BM. She is not currently taking  anything for constipation. Denies rectal bleeding or melena. Drinking about 1-2 bottles per day. Denies hard stools or straining to defecate. She is unsure if she tried linzess after her colonoscopy.   Last Colonoscopy:09/2022- Preparation of the colon was inadequate.                           - One 12 mm polyp in the proximal ascending colon,                            removed with a hot snare. Resected and retrieved.                            Clip was placed. Clip manufacturer: [employer].                           - One 8 mm polyp in the mid ascending colon,                            removed with a cold snare. Resected and retrieved.                           -  Stool in the entire examined colon precluding a                            detailed examination.                           - The rest of the examined colon is normal.                            Biopsied.                           - The distal rectum and anal verge are normal on                            retroflexion view. Last EGD: 09/2022- Mucosal nodule found in the esophagus. Biopsied.                           - No endoscopic esophageal abnormality to explain                            patient's dysphagia. Esophagus dilated. Dilated.                           - LA Grade B reflux esophagitis with no bleeding.                           - 4 cm hiatal hernia.                           - Normal stomach. Biopsied.                           - Normal examined duodenum. Biopsied. esophageal biopsies consistent with reflux, normal gastric/SB and random colonic biopsies, had two SSLs repeat colonoscopy in 1 year given poor prep   Past Medical History:  Diagnosis Date   Anxiety    Arthritis    Complication of anesthesia    Slow to wake up   History of hiatal hernia    IDA (iron deficiency anemia) 11/24/2018   Nausea and vomiting 11/26/2020   Pneumonia     Past Surgical History:  Procedure  Laterality Date   BIOPSY  09/24/2022   Procedure: BIOPSY;  Surgeon: Dolores Frame, MD;  [facility];  Service: Gastroenterology;;   CHOLECYSTECTOMY     COLONOSCOPY  02/04/2018   Dr. Marcha Solders, 3 polyps between 5-41mm and 3 cm from anal verge, repeat colonoscopy in 5 years   COLONOSCOPY WITH PROPOFOL N/A 09/24/2022   Procedure: COLONOSCOPY WITH PROPOFOL;  Surgeon: Dolores Frame, MD;  [facility];  Service: Gastroenterology;  Laterality: N/A;  915 ASA 2   ESOPHAGEAL MANOMETRY N/A 01/02/2021   Procedure: ESOPHAGEAL MANOMETRY (EM);  Surgeon: Napoleon Form, MD;  [facility]: [facility];  Service: Endoscopy;  Laterality: N/A;   ESOPHAGOGASTRODUODENOSCOPY (EGD) WITH PROPOFOL N/A 11/28/2020   Castaneda:6cm hh with a few cameron erosions, normal stomach, normal examined duodenum, no specimens collected.   ESOPHAGOGASTRODUODENOSCOPY (EGD) WITH PROPOFOL N/A 09/24/2022  Procedure: ESOPHAGOGASTRODUODENOSCOPY (EGD) WITH PROPOFOL;  Surgeon: Dolores Frame, MD;  [facility];  Service: Gastroenterology;  Laterality: N/A;   EXTRACORPOREAL SHOCK WAVE LITHOTRIPSY Left 11/14/2022   Procedure: EXTRACORPOREAL SHOCK WAVE LITHOTRIPSY (ESWL);  Surgeon: Alfredo Martinez, MD;  Location[facility];  Service: Urology;  Laterality: Left;   GIVENS CAPSULE STUDY N/A 12/01/2018   Procedure: GIVENS CAPSULE STUDY;  Surgeon: Malissa Hippo, MD;  [facility];  Service: Endoscopy;  Laterality: N/A;  7:30   HEMOSTASIS CLIP PLACEMENT  09/24/2022   Procedure: HEMOSTASIS CLIP PLACEMENT;  Surgeon: Dolores Frame, MD;  [facility];  Service: Gastroenterology;;   JOINT REPLACEMENT Left 07/2020   total L hip   PARTIAL HYSTERECTOMY     POLYPECTOMY  09/24/2022   Procedure: POLYPECTOMY INTESTINAL;  Surgeon: Dolores Frame, MD;  [facility];  Service: Gastroenterology;;   Gaspar Bidding DILATION   09/24/2022   Procedure: Gaspar Bidding DILATION;  Surgeon: Marguerita Merles, Reuel Boom, MD;  [facility];  Service: Gastroenterology;;   XI ROBOTIC ASSISTED HIATAL HERNIA REPAIR N/A 02/11/2021   Procedure: XI ROBOTIC ASSISTED HIATAL HERNIA REPAIR WITH NISSAN FUNDOPLICATION;  Surgeon: Quentin Ore, MD;  Location: [facility];  Service: General;  Laterality: N/A;    Current Outpatient Medications  Medication Sig Dispense Refill   acetaminophen (TYLENOL) 650 MG CR tablet Take 1,300 mg by mouth every 8 (eight) hours as needed for pain. Takes as needed     clonazePAM (KLONOPIN) 1 MG tablet Take 1 mg by mouth at bedtime.     cyclobenzaprine (FLEXERIL) 10 MG tablet Take 10 mg by mouth 3 (three) times daily as needed for muscle spasms.     dicyclomine (BENTYL) 10 MG capsule Take 1 capsule (10 mg total) by mouth every 12 (twelve) hours as needed for spasms (abdominal pain). 60 capsule 1   escitalopram (LEXAPRO) 20 MG tablet Take 20 mg by mouth daily.     meloxicam (MOBIC) 7.5 MG tablet Take 7.5 mg by mouth daily.     No current facility-administered medications for this visit.    Allergies as of 09/01/2023   (No Known Allergies)    No family history on file.  Social History   Socioeconomic History   Marital status: Married    Spouse name: Not on file   Number of children: Not on file   Years of education: Not on file   Highest education level: Not on file  Occupational History   Not on file  Tobacco Use   Smoking status: Never   Smokeless tobacco: Never  Vaping Use   Vaping status: Never Used  Substance and Sexual Activity   Alcohol use: Never   Drug use: Never   Sexual activity: Not Currently  Other Topics Concern   Not on file  Social History Narrative   Not on file   Social Determinants of Health   Financial Resource Strain: Low Risk  (03/02/2018)   Received from [facility], [facility]   Overall Financial Resource Strain (CARDIA)    Difficulty of Paying  Living Expenses: Not hard at all  Food Insecurity: No Food Insecurity (03/02/2018)   Received from [facility], [facility]   Hunger Vital Sign    Worried About Running Out of Food in the Last Year: Never true    Ran Out of Food in the Last Year: Never true  Transportation Needs: No Transportation Needs (03/02/2018)   Received  from [facility], [facility]   [facility] - Transportation    Lack of Transportation (Medical): No    Lack of Transportation (Non-Medical): No  Physical Activity: Sufficiently Active (03/02/2018)   Received from [facility], [facility]   Exercise Vital Sign    Days of Exercise per Week: 4 days    Minutes of Exercise per Session: 60 min  Stress: No Stress Concern Present (03/02/2018)   Received from [facility], [facility] - Occupational Stress Questionnaire    Feeling of Stress : Not at all  Social Connections: Moderately Isolated (03/02/2018)   Received from [facility], [facility]   Social Connection and Isolation Panel [NHANES]    Frequency of Communication with Friends and Family: Once a week    Frequency of Social Gatherings with Friends and Family: Once a week    Attends Religious Services: Never    Database administrator or Organizations: No    Attends Engineer, structural: Never    Marital Status: Married    Review of systems General: negative for malaise, night sweats, fever, chills, weight los Neck: Negative for lumps, goiter, pain and significant neck swelling Resp: Negative for cough, wheezing, dyspnea at rest CV: Negative for chest pain, leg swelling, palpitations, orthopnea GI: denies melena, hematochezia, nausea, vomiting, odyonophagia, early satiety or unintentional weight loss. +loose stools +fecal incontinence +dysphagia +GERD symptoms  MSK: Negative for joint pain or swelling, back pain, and muscle pain. Derm: Negative for itching or rash Psych:  Denies depression, anxiety, memory loss, confusion. No homicidal or suicidal ideation.  Heme: Negative for prolonged bleeding, bruising easily, and swollen nodes. Endocrine: Negative for cold or heat intolerance, polyuria, polydipsia and goiter. Neuro: negative for tremor, gait imbalance, syncope and seizures. The remainder of the review of systems is noncontributory.  Physical Exam: BP 135/69 (BP Location: Right Arm, Patient Position: Sitting, Cuff Size: Normal)   Pulse 87   Temp 98.1 F (36.7 C) (Oral)   Ht 5\' 2"  (1.575 m)   Wt 180 lb 14.4 oz (82.1 kg)   BMI 33.09 kg/m  General:   Alert and oriented. No distress noted. Pleasant and cooperative.  Head:  Normocephalic and atraumatic. Eyes:  Conjuctiva clear without scleral icterus. Mouth:  Oral mucosa pink and moist. Good dentition. No lesions. Heart: Normal rate and rhythm, s1 and s2 heart sounds present.  Lungs: Clear lung sounds in all lobes. Respirations equal and unlabored. Abdomen:  +BS, soft, non-tender and non-distended. No rebound or guarding. No HSM or masses noted. Derm: No palmar erythema or jaundice Msk:  Symmetrical without gross deformities. Normal posture. Extremities:  Without edema. Neurologic:  Alert and  oriented x4 Psych:  Alert and cooperative. Normal mood and affect.  Invalid input(s): "6 MONTHS"   ASSESSMENT: Anne Edwards is a 62 y.o. female presenting today for GERD/dysphagia and bowel incontinence  GERD/Dysphagia: Fairly recent EGD in December 2023 with 4 cm hiatal hernia and grade B reflux esophagitis.  Patient was advised to take omeprazole 40 mg daily thereafter though she reports no improvement in her symptoms of this so she stopped it.  Having frequent acid regurgitation and burning in her throat and chest as well as continued dysphagia with food feeling that it stops at sternal notch.  Given recent EGD would recommend proceeding with barium pill esophagram for further evaluation of her dysphagia and  will start Protonix 40 mg twice  daily.  Discussed good reflux precautions to include avoiding greasy, spicy, tomato/citrus based foods, caffeine, chocolate, alcohol, staying upright 2 to 3 hours after eating and avoid eating late.  May consider pH impedance testing/esophageal manometry pending clinical course and results of the BPE  Bowel incontinence: Patient with history of underlying constipation noting she can go up to a week without a bowel movement at times.  Does not think she ever tried Linzess that was prescribed after colonoscopy.  She is not currently taking a thing for constipation though reports having more looser stools and fecal incontinence at times.  Notably CT abdomen earlier this year showed moderate stool throughout the colon.  Suspect we may be dealing with overflow diarrhea, recommend to start MiraLAX 1 capful twice daily x 1 week then start Benefiber 1 tablespoon twice daily with a meal thereafter.  Needs to ensure water intake is good and that is high in fruits, veggies, whole grains.  If symptoms are not improving we will consider an rectal manometry for further evaluation.  Of note last colonoscopy in December 2023 with an adequate prep she was advised to repeat colonoscopy in 1 year.  Will get her scheduled for this.  PLAN:  Start protonix 40mg  BID and reflux precautions  2. Barium pill esophagram  3. Schedule Colonoscopy-2 day prep ASA II 4. Consider anorectal manometry if fecal incontinence not improving  5. Increase water intake, aim for atleast 64 oz per day Increase fruits, veggies and whole grains, kiwi and prunes are especially good for constipation 6. Miralax 1 capful BID x 1 week 7. Benefiber 1T BID with a meal thereafter  All questions were answered, patient verbalized understanding and is in agreement with plan as outlined above.   Follow Up: 3-4 months   Odena Mcquaid L. Melita Villalona, MSN, APRN, AGNP-C Adult-Gerontology Nurse Practitioner [facility]  [facility]  I have reviewed the note and agree with the APP's assessment as described in this progress note  If persistent symptoms of dysphagia/heartburn while on PPI with negative BPE , will need to proceed with esophageal manometry and pH impedance testing off PPI  Katrinka Blazing, MD Gastroenterology and Hepatology [facility]astroenterology

## 2023-09-01 NOTE — Telephone Encounter (Signed)
Pt seen in office today and needing TCS scheduled. I was working on a STAT when encounter form was brought to me. Left message for pt to return call.  (ASA 2//Anne Edwards)

## 2023-09-02 ENCOUNTER — Telehealth (INDEPENDENT_AMBULATORY_CARE_PROVIDER_SITE_OTHER): Payer: Self-pay | Admitting: Gastroenterology

## 2023-09-02 NOTE — Telephone Encounter (Signed)
Pt scheduled for esophagram on 09/04/23.  Pt returned call and left voicemail that she had to work that day and needed to reschedule. Returned call to patient and gave her Central Scheduling number to call and reschedule. Pt verbalized understanding.

## 2023-09-04 ENCOUNTER — Other Ambulatory Visit (HOSPITAL_COMMUNITY): Payer: Medicare HMO

## 2023-09-07 ENCOUNTER — Encounter (INDEPENDENT_AMBULATORY_CARE_PROVIDER_SITE_OTHER): Payer: Self-pay | Admitting: *Deleted

## 2023-09-16 ENCOUNTER — Encounter (INDEPENDENT_AMBULATORY_CARE_PROVIDER_SITE_OTHER): Payer: Self-pay

## 2023-09-16 ENCOUNTER — Ambulatory Visit (HOSPITAL_COMMUNITY)
Admission: RE | Admit: 2023-09-16 | Discharge: 2023-09-16 | Disposition: A | Discharge status: Home / Self Care | Payer: Medicare HMO | Source: Ambulatory Visit | Attending: Gastroenterology | Admitting: Gastroenterology

## 2023-09-16 DIAGNOSIS — R1319 Other dysphagia: Secondary | ICD-10-CM | POA: Insufficient documentation

## 2023-09-16 DIAGNOSIS — R1310 Dysphagia, unspecified: Secondary | ICD-10-CM | POA: Diagnosis not present

## 2023-09-16 DIAGNOSIS — K219 Gastro-esophageal reflux disease without esophagitis: Secondary | ICD-10-CM | POA: Diagnosis not present

## 2023-09-23 MED ORDER — PEG 3350-KCL-NA BICARB-NACL 420 G PO SOLR: 4000.0000 mL | Freq: Once | ORAL | 0 refills | Status: AC

## 2023-09-23 NOTE — Addendum Note (Signed)
Addended by: Marlowe Shores on: 09/23/2023 11:15 AM   Modules accepted: Orders

## 2023-09-29 ENCOUNTER — Ambulatory Visit (INDEPENDENT_AMBULATORY_CARE_PROVIDER_SITE_OTHER): Payer: Medicare HMO | Admitting: Gastroenterology

## 2023-10-08 ENCOUNTER — Encounter (HOSPITAL_COMMUNITY): Payer: Self-pay

## 2023-10-08 ENCOUNTER — Encounter (HOSPITAL_COMMUNITY)
Admission: RE | Admit: 2023-10-08 | Discharge: 2023-10-08 | Disposition: A | Discharge status: Home / Self Care | Payer: Medicare HMO | Source: Ambulatory Visit | Attending: Gastroenterology | Admitting: Gastroenterology

## 2023-10-13 ENCOUNTER — Ambulatory Visit (HOSPITAL_COMMUNITY)
Admission: RE | Admit: 2023-10-13 | Discharge: 2023-10-13 | Disposition: A | Discharge status: Home / Self Care | Payer: Medicare HMO | Attending: Gastroenterology | Admitting: Gastroenterology

## 2023-10-13 ENCOUNTER — Encounter (HOSPITAL_COMMUNITY): Payer: Self-pay | Admitting: Gastroenterology

## 2023-10-13 ENCOUNTER — Ambulatory Visit (HOSPITAL_BASED_OUTPATIENT_CLINIC_OR_DEPARTMENT_OTHER): Payer: Medicare HMO | Admitting: Anesthesiology

## 2023-10-13 ENCOUNTER — Encounter (INDEPENDENT_AMBULATORY_CARE_PROVIDER_SITE_OTHER): Payer: Self-pay | Admitting: *Deleted

## 2023-10-13 ENCOUNTER — Ambulatory Visit (HOSPITAL_COMMUNITY): Payer: Self-pay | Admitting: Anesthesiology

## 2023-10-13 ENCOUNTER — Encounter (HOSPITAL_COMMUNITY)
Admission: RE | Disposition: A | Discharge status: Home / Self Care | Payer: Self-pay | Source: Home / Self Care | Attending: Gastroenterology

## 2023-10-13 DIAGNOSIS — D124 Benign neoplasm of descending colon: Secondary | ICD-10-CM | POA: Insufficient documentation

## 2023-10-13 DIAGNOSIS — Z860100 Personal history of colon polyps, unspecified: Secondary | ICD-10-CM

## 2023-10-13 DIAGNOSIS — K449 Diaphragmatic hernia without obstruction or gangrene: Secondary | ICD-10-CM | POA: Diagnosis not present

## 2023-10-13 DIAGNOSIS — Z1211 Encounter for screening for malignant neoplasm of colon: Secondary | ICD-10-CM | POA: Diagnosis not present

## 2023-10-13 DIAGNOSIS — K219 Gastro-esophageal reflux disease without esophagitis: Secondary | ICD-10-CM | POA: Diagnosis not present

## 2023-10-13 DIAGNOSIS — K5730 Diverticulosis of large intestine without perforation or abscess without bleeding: Secondary | ICD-10-CM | POA: Insufficient documentation

## 2023-10-13 DIAGNOSIS — Z860101 Personal history of adenomatous and serrated colon polyps: Secondary | ICD-10-CM

## 2023-10-13 DIAGNOSIS — K6289 Other specified diseases of anus and rectum: Secondary | ICD-10-CM

## 2023-10-13 DIAGNOSIS — Z79899 Other long term (current) drug therapy: Secondary | ICD-10-CM | POA: Insufficient documentation

## 2023-10-13 DIAGNOSIS — Z791 Long term (current) use of non-steroidal anti-inflammatories (NSAID): Secondary | ICD-10-CM | POA: Diagnosis not present

## 2023-10-13 DIAGNOSIS — D649 Anemia, unspecified: Secondary | ICD-10-CM | POA: Insufficient documentation

## 2023-10-13 DIAGNOSIS — F419 Anxiety disorder, unspecified: Secondary | ICD-10-CM | POA: Insufficient documentation

## 2023-10-13 DIAGNOSIS — R159 Full incontinence of feces: Secondary | ICD-10-CM | POA: Insufficient documentation

## 2023-10-13 DIAGNOSIS — K635 Polyp of colon: Secondary | ICD-10-CM | POA: Diagnosis not present

## 2023-10-13 DIAGNOSIS — M1990 Unspecified osteoarthritis, unspecified site: Secondary | ICD-10-CM | POA: Insufficient documentation

## 2023-10-13 DIAGNOSIS — I10 Essential (primary) hypertension: Secondary | ICD-10-CM | POA: Diagnosis not present

## 2023-10-13 DIAGNOSIS — Z09 Encounter for follow-up examination after completed treatment for conditions other than malignant neoplasm: Secondary | ICD-10-CM | POA: Insufficient documentation

## 2023-10-13 HISTORY — PX: COLONOSCOPY WITH PROPOFOL: SHX5780

## 2023-10-13 HISTORY — PX: POLYPECTOMY: SHX5525

## 2023-10-13 HISTORY — PX: SUBMUCOSAL LIFTING INJECTION: SHX6855

## 2023-10-13 LAB — HM COLONOSCOPY

## 2023-10-13 SURGERY — COLONOSCOPY WITH PROPOFOL
Anesthesia: General

## 2023-10-13 MED ORDER — PROPOFOL 500 MG/50ML IV EMUL
INTRAVENOUS | Status: DC | PRN
  Administered 2023-10-13: 150 ug/kg/min via INTRAVENOUS

## 2023-10-13 MED ORDER — LIDOCAINE HCL (CARDIAC) PF 100 MG/5ML IV SOSY
PREFILLED_SYRINGE | INTRAVENOUS | Status: DC | PRN
  Administered 2023-10-13: 50 mg via INTRATRACHEAL

## 2023-10-13 MED ORDER — PROPOFOL 10 MG/ML IV BOLUS
INTRAVENOUS | Status: DC | PRN
  Administered 2023-10-13: 80 mg via INTRAVENOUS

## 2023-10-13 MED ORDER — LACTATED RINGERS IV SOLN: INTRAVENOUS | Status: DC

## 2023-10-13 NOTE — Op Note (Addendum)
 [facility] Patient Name: Anne Edwards Procedure Date: 10/13/2023 9:24 AM MRN: [id number] Date of Birth: [birth year] Attending MD: Toribio Fortune , , [id number] CSN: [id number] Age: 62 Admit Type: Outpatient Procedure:                Colonoscopy Indications:              Surveillance: Personal history of adenomatous                            polyps, inadequate prep on last colonoscopy (less                            than 1 year ago) Providers:                Toribio Fortune, Devere Lodge, Daphne Mulch                            Technician, Technician Referring MD:              Medicines:                Monitored Anesthesia Care Complications:            No immediate complications. Estimated Blood Loss:     Estimated blood loss: none. Procedure:                Pre-Anesthesia Assessment:                           - Prior to the procedure, a History and Physical                            was performed, and patient medications, allergies                            and sensitivities were reviewed. The patient's                            tolerance of previous anesthesia was reviewed.                           - The risks and benefits of the procedure and the                            sedation options and risks were discussed with the                            patient. All questions were answered and informed                            consent was obtained.                           - ASA Grade Assessment: III - A patient with severe                            systemic disease.  After obtaining informed consent, the colonoscope                            was passed under direct vision. Throughout the                            procedure, the patient's blood pressure, pulse, and                            oxygen saturations were monitored continuously. The                            [id number]-HQ190L ([id number]) scope was introduced through                             the anus and advanced to the the cecum, identified                            by appendiceal orifice and ileocecal valve. The                            colonoscopy was performed without difficulty. The                            patient tolerated the procedure well. The quality                            of the bowel preparation was adequate to identify                            polyps. Scope In: 9:40:06 AM Scope Out: 10:09:49 AM Scope Withdrawal Time: 0 hours 24 minutes 19 seconds  Total Procedure Duration: 0 hours 29 minutes 43 seconds  Findings:      A 12 mm polyp was found in the descending colon. The polyp was       semi-sessile. Area was successfully injected with 2 mL Eleview for a       lift polypectomy. Imaging was performed using white light and narrow       band imaging to visualize the mucosa and demarcate the polyp site after       injection for EMR purposes. The polyp was removed with a hot snare.       Resection and retrieval were complete.      A 6 mm polyp was found in the descending colon. The polyp was sessile.       The polyp was removed with a hot snare. Resection and retrieval were       complete.      A few small-mouthed diverticula were found in the sigmoid colon.      The retroflexed view of the distal rectum and anal verge was normal and       showed no anal or rectal abnormalities.      The digital rectal exam findings include decreased sphincter tone. Impression:               - One 12 mm polyp in the descending colon, removed  with a hot snare. Resected and retrieved via EMR.                           - One 6 mm polyp in the descending colon, removed                            with a hot snare. Resected and retrieved.                           - Diverticulosis in the sigmoid colon.                           - The distal rectum and anal verge are normal on                            retroflexion view.                            - Decreased sphincter tone found on digital rectal                            exam. Moderate Sedation:      Per Anesthesia Care Recommendation:           - Discharge patient to home (ambulatory).                           - Resume previous diet.                           - Await pathology results.                           - Repeat colonoscopy in 3 years for surveillance.                           - Referral for anorectal manometry. Procedure Code(s):        --- Professional ---                           [phone], 59, Colonoscopy, flexible; with removal of                            tumor(s), polyp(s), or other lesion(s) by snare                            technique                           45381, Colonoscopy, flexible; with directed                            submucosal injection(s), any substance Diagnosis Code(s):        --- Professional ---                           Z86.010, Personal  history of colonic polyps                           D12.4, Benign neoplasm of descending colon                           K62.89, Other specified diseases of anus and rectum                           K57.30, Diverticulosis of large intestine without                            perforation or abscess without bleeding CPT copyright 2022 American Medical Association. All rights reserved. The codes documented in this report are preliminary and upon coder review may  be revised to meet current compliance requirements. Toribio Fortune, MD Toribio Fortune,  10/13/2023 10:25:09 AM This report has been signed electronically. Number of Addenda: 0

## 2023-10-13 NOTE — Anesthesia Postprocedure Evaluation (Signed)
 Anesthesia Post Note  Patient: Anne Edwards  Procedure(s) Performed: COLONOSCOPY WITH PROPOFOL  POLYPECTOMY SUBMUCOSAL LIFTING INJECTION  Patient location during evaluation: Short Stay Anesthesia Type: General Level of consciousness: awake and alert Pain management: pain level controlled Vital Signs Assessment: post-procedure vital signs reviewed and stable Respiratory status: spontaneous breathing Cardiovascular status: blood pressure returned to baseline and stable Postop Assessment: no apparent nausea or vomiting Anesthetic complications: no   No notable events documented.   Last Vitals:  Vitals:   10/13/23 0815  BP: (!) 112/95  Pulse: 93  Resp: 16  Temp: 36.7 C  SpO2: 100%    Last Pain:  Vitals:   10/13/23 0935  PainSc: 0-No pain                 Kodah Maret

## 2023-10-13 NOTE — Anesthesia Preprocedure Evaluation (Signed)
 Anesthesia Evaluation  Patient identified by MRN, date of birth, ID band Patient awake    Reviewed: Allergy & Precautions, H&P , NPO status , Patient's Chart, lab work & pertinent test results, reviewed documented beta blocker date and time   History of Anesthesia Complications (+) history of anesthetic complications  Airway Mallampati: II  TM Distance: >3 FB Neck ROM: full    Dental no notable dental hx.    Pulmonary neg pulmonary ROS, pneumonia   Pulmonary exam normal breath sounds clear to auscultation       Cardiovascular Exercise Tolerance: Good hypertension, negative cardio ROS  Rhythm:regular Rate:Normal     Neuro/Psych   Anxiety      Neuromuscular disease negative neurological ROS  negative psych ROS   GI/Hepatic negative GI ROS, Neg liver ROS, hiatal hernia,GERD  ,,  Endo/Other  negative endocrine ROS    Renal/GU negative Renal ROS  negative genitourinary   Musculoskeletal   Abdominal   Peds  Hematology negative hematology ROS (+) Blood dyscrasia, anemia   Anesthesia Other Findings   Reproductive/Obstetrics negative OB ROS                             Anesthesia Physical Anesthesia Plan  ASA: 2  Anesthesia Plan: General   Post-op Pain Management:    Induction:   PONV Risk Score and Plan: Propofol  infusion  Airway Management Planned:   Additional Equipment:   Intra-op Plan:   Post-operative Plan:   Informed Consent: I have reviewed the patients History and Physical, chart, labs and discussed the procedure including the risks, benefits and alternatives for the proposed anesthesia with the patient or authorized representative who has indicated his/her understanding and acceptance.     Dental Advisory Given  Plan Discussed with: CRNA  Anesthesia Plan Comments:        Anesthesia Quick Evaluation

## 2023-10-13 NOTE — Transfer of Care (Signed)
 Immediate Anesthesia Transfer of Care Note  Patient: Anne Edwards  Procedure(s) Performed: COLONOSCOPY WITH PROPOFOL  POLYPECTOMY SUBMUCOSAL LIFTING INJECTION  Patient Location: Short Stay  Anesthesia Type:General  Level of Consciousness: awake  Airway & Oxygen Therapy: Patient Spontanous Breathing  Post-op Assessment: Report given to RN  Post vital signs: Reviewed  Last Vitals:  Vitals Value Taken Time  BP    Temp    Pulse    Resp    SpO2      Last Pain:  Vitals:   10/13/23 0935  PainSc: 0-No pain         Complications: No notable events documented.

## 2023-10-13 NOTE — H&P (Signed)
 Anne Edwards is an 62 y.o. female.   Chief Complaint: history of colon poylps and unstatisfactory colonoscopy. HPI: 62 year old female with past medical history of anxiety, iron deficiency anemia, hiatal hernia, arthritis, who comes for history of colon polyps and unsatisfactory colonoscopy.  Patient has presented some episodes of fecal incontinence.The patient denies having any nausea, vomiting, fever, chills, hematochezia, melena, hematemesis, abdominal distention, abdominal pain, diarrhea, jaundice, pruritus or weight loss.  Last colonoscopy was performed in 09/24/2022, had 2 SSL's but poor prep.   Past Medical History:  Diagnosis Date   Anxiety    Arthritis    Complication of anesthesia    Slow to wake up   History of hiatal hernia    IDA (iron deficiency anemia) 11/24/2018   Nausea and vomiting 11/26/2020   Pneumonia     Past Surgical History:  Procedure Laterality Date   BIOPSY  09/24/2022   Procedure: BIOPSY;  Surgeon: Eartha Angelia Sieving, MD;  Location: [facility];  Service: Gastroenterology;;   CHOLECYSTECTOMY     COLONOSCOPY  02/04/2018   Dr. Maranda, 3 polyps between 5-59mm and 3 cm from anal verge, repeat colonoscopy in 5 years   COLONOSCOPY WITH PROPOFOL  N/A 09/24/2022   Procedure: COLONOSCOPY WITH PROPOFOL ;  Surgeon: Eartha Angelia Sieving, MD;  Location: [facility];  Service: Gastroenterology;  Laterality: N/A;  915 ASA 2   ESOPHAGEAL MANOMETRY N/A 01/02/2021   Procedure: ESOPHAGEAL MANOMETRY (EM);  Surgeon: Shila Gustav GAILS, MD;  Location: [facility];  Service: Endoscopy;  Laterality: N/A;   ESOPHAGOGASTRODUODENOSCOPY (EGD) WITH PROPOFOL  N/A 11/28/2020   Castaneda:6cm hh with a few cameron erosions, normal stomach, normal examined duodenum, no specimens collected.   ESOPHAGOGASTRODUODENOSCOPY (EGD) WITH PROPOFOL  N/A 09/24/2022   Procedure: ESOPHAGOGASTRODUODENOSCOPY (EGD) WITH PROPOFOL ;  Surgeon: Eartha Angelia Sieving, MD;  [facility];  Service: Gastroenterology;  Laterality: N/A;   EXTRACORPOREAL SHOCK WAVE LITHOTRIPSY Left 11/14/2022   Procedure: EXTRACORPOREAL SHOCK WAVE LITHOTRIPSY (ESWL);  Surgeon: Gaston Hamilton, MD;  Location: [facility];  Service: Urology;  Laterality: Left;   GIVENS CAPSULE STUDY N/A 12/01/2018   Procedure: GIVENS CAPSULE STUDY;  Surgeon: Golda Claudis PENNER, MD;  Location: [facility];  Service: Endoscopy;  Laterality: N/A;  7:30   HEMOSTASIS CLIP PLACEMENT  09/24/2022   Procedure: HEMOSTASIS CLIP PLACEMENT;  Surgeon: Eartha Angelia Sieving, MD;  [facility]: [facility];  Service: Gastroenterology;;   JOINT REPLACEMENT Left 07/2020   total L hip   PARTIAL HYSTERECTOMY     POLYPECTOMY  09/24/2022   Procedure: POLYPECTOMY INTESTINAL;  Surgeon: Eartha Angelia Sieving, MD;  [facility];  Service: Gastroenterology;;   HARLEY DILATION  09/24/2022   Procedure: HARLEY DILATION;  Surgeon: Eartha Angelia, Sieving, MD;  Location: [facility];  Service: Gastroenterology;;   XI ROBOTIC ASSISTED HIATAL HERNIA REPAIR N/A 02/11/2021   Procedure: XI ROBOTIC ASSISTED HIATAL HERNIA REPAIR WITH NISSAN FUNDOPLICATION;  Surgeon: Lyndel Deward PARAS, MD;  Location: [facility];  Service: General;  Laterality: N/A;    History reviewed. No pertinent family history. Social History:  reports that she has never smoked. She has never used smokeless tobacco. She reports that she does not drink alcohol  and does not use drugs.  Allergies: No Known Allergies  Medications Prior to Admission  Medication Sig Dispense Refill   acetaminophen  (TYLENOL ) 650 MG CR tablet Take 1,300 mg by mouth every 8 (eight) hours as needed for pain. Takes as needed     clonazePAM  (KLONOPIN ) 1  MG tablet Take 1 mg by mouth at bedtime.     cyclobenzaprine  (FLEXERIL ) 10 MG tablet Take 10 mg by mouth 3 (three) times daily as needed for muscle spasms.     dicyclomine  (BENTYL ) 10 MG capsule Take 1 capsule (10 mg  total) by mouth every 12 (twelve) hours as needed for spasms (abdominal pain). 60 capsule 1   escitalopram  (LEXAPRO ) 20 MG tablet Take 20 mg by mouth daily.     meloxicam (MOBIC) 7.5 MG tablet Take 7.5 mg by mouth daily.     pantoprazole  (PROTONIX ) 40 MG tablet Take 1 tablet (40 mg total) by mouth 2 (two) times daily. 90 tablet 2    No results found for this or any previous visit (from the past 48 hours). No results found.  Review of Systems  All other systems reviewed and are negative.   Blood pressure (!) 112/95, pulse 93, temperature 98.1 F (36.7 C), resp. rate 16, height 5' 2 (1.575 m), weight 79.4 kg, SpO2 100%. Physical Exam  GENERAL: The patient is AO x3, in no acute distress. HEENT: Head is normocephalic and atraumatic. EOMI are intact. Mouth is well hydrated and without lesions. NECK: Supple. No masses LUNGS: Clear to auscultation. No presence of rhonchi/wheezing/rales. Adequate chest expansion HEART: RRR, normal s1 and s2. ABDOMEN: Soft, nontender, no guarding, no peritoneal signs, and nondistended. BS +. No masses. EXTREMITIES: Without any cyanosis, clubbing, rash, lesions or edema. NEUROLOGIC: AOx3, no focal motor deficit. SKIN: no jaundice, no rashes  Assessment/Plan 62 year old female with past medical history of anxiety, iron deficiency anemia, hiatal hernia, arthritis, who comes for history of colon polyps and unsatisfactory colonoscopy.  Will proceed with colonoscopy.  Toribio Eartha Flavors, MD 10/13/2023, 8:59 AM

## 2023-10-13 NOTE — Discharge Instructions (Signed)
You are being discharged to home.  Resume your previous diet.  We are waiting for your pathology results.  Your physician has recommended a repeat colonoscopy in three years for surveillance.  Referral for anorectal manometry.

## 2023-10-15 ENCOUNTER — Telehealth (INDEPENDENT_AMBULATORY_CARE_PROVIDER_SITE_OTHER): Payer: Self-pay | Admitting: *Deleted

## 2023-10-15 LAB — SURGICAL PATHOLOGY

## 2023-10-15 NOTE — Telephone Encounter (Signed)
 Referral sent, they will contact patient with apt

## 2023-10-15 NOTE — Telephone Encounter (Signed)
-----   Message from Daniel Castaneda Mayorga sent at 10/13/2023 10:29 AM EST ----- Erskin Caldron, can you please refer the patient to [facility] for anorectal manometry? Dx: fecal incontinence  Thanks,   Toribio Fortune, MD Gastroenterology and Hepatology [facility]

## 2023-10-16 ENCOUNTER — Encounter (INDEPENDENT_AMBULATORY_CARE_PROVIDER_SITE_OTHER): Payer: Self-pay | Admitting: *Deleted

## 2023-10-20 DIAGNOSIS — R294 Clicking hip: Secondary | ICD-10-CM | POA: Diagnosis not present

## 2023-10-22 ENCOUNTER — Other Ambulatory Visit: Payer: Self-pay | Admitting: Orthopedic Surgery

## 2023-10-22 DIAGNOSIS — M25552 Pain in left hip: Secondary | ICD-10-CM | POA: Diagnosis not present

## 2023-10-22 DIAGNOSIS — T84031A Mechanical loosening of internal left hip prosthetic joint, initial encounter: Secondary | ICD-10-CM

## 2023-10-23 ENCOUNTER — Encounter (HOSPITAL_COMMUNITY): Payer: Self-pay | Admitting: Gastroenterology

## 2023-10-26 ENCOUNTER — Encounter: Payer: Self-pay | Admitting: Orthopedic Surgery

## 2023-10-26 DIAGNOSIS — R030 Elevated blood-pressure reading, without diagnosis of hypertension: Secondary | ICD-10-CM | POA: Diagnosis not present

## 2023-10-26 DIAGNOSIS — Z6832 Body mass index (BMI) 32.0-32.9, adult: Secondary | ICD-10-CM | POA: Diagnosis not present

## 2023-10-26 DIAGNOSIS — M25552 Pain in left hip: Secondary | ICD-10-CM | POA: Diagnosis not present

## 2023-10-29 DIAGNOSIS — M25552 Pain in left hip: Secondary | ICD-10-CM | POA: Diagnosis not present

## 2023-11-03 NOTE — Progress Notes (Signed)
Sent message, via epic in basket, requesting orders in epic from surgeon.  

## 2023-11-04 NOTE — Progress Notes (Signed)
Anesthesia Review:  PCP: Cardiologist : Chest x-ray : EKG : Echo : Stress test: Cardiac Cath :  Activity level:  Sleep Study/ CPAP : Fasting Blood Sugar :      / Checks Blood Sugar -- times a day:   Blood Thinner/ Instructions /Last Dose: ASA / Instructions/ Last Dose :    10/13/23- Colonowscopy

## 2023-11-04 NOTE — Patient Instructions (Signed)
SURGICAL WAITING ROOM VISITATION  Patients having surgery or a procedure may have no more than 2 support people in the waiting area - these visitors may rotate.    Children under the age of 75 must have an adult with them who is not the patient.  Due to an increase in RSV and influenza rates and associated hospitalizations, children ages 29 and under may not visit patients in [facility] hospitals.  Visitors with respiratory illnesses are discouraged from visiting and should remain at home.  If the patient needs to stay at the hospital during part of their recovery, the visitor guidelines for inpatient rooms apply. Pre-op nurse will coordinate an appropriate time for 1 support person to accompany patient in pre-op.  This support person may not rotate.    Please refer to the [facility] website for the visitor guidelines for Inpatients (after your surgery is over and you are in a regular room).       Your procedure is scheduled on:  11/17/2022    Report to [facility] Main Entrance    Report to admitting at  0900 AM   Call this number if you have problems the morning of surgery [phone]   Do not eat food :After Midnight.   After Midnight you may have the following liquids until _ 0815_____ AM/  DAY OF SURGERY  Water Non-Citrus Juices (without pulp, NO RED-Apple, White grape, White cranberry) Black Coffee (NO MILK/CREAM OR CREAMERS, sugar ok)  Clear Tea (NO MILK/CREAM OR CREAMERS, sugar ok) regular and decaf                             Plain Jell-O (NO RED)                                           Fruit ices (not with fruit pulp, NO RED)                                     Popsicles (NO RED)                                                               Sports drinks like Gatorade (NO RED)                    The day of surgery:  Drink ONE (1) Pre-Surgery Clear Ensure or G2 at  0815AM  9 have completed by ) the morning of surgery. Drink in one sitting. Do not sip.   This drink was given to you during your hospital  pre-op appointment visit. Nothing else to drink after completing the  Pre-Surgery Clear Ensure or G2.          If you have questions, please contact your surgeon's office.   FOLLOW BOWEL PREP AND ANY ADDITIONAL PRE OP INSTRUCTIONS YOU RECEIVED FROM YOUR SURGEON'S OFFICE!!!     Oral Hygiene is also important to reduce your risk of infection.  Remember - BRUSH YOUR TEETH THE MORNING OF SURGERY WITH YOUR REGULAR TOOTHPASTE  DENTURES WILL BE REMOVED PRIOR TO SURGERY PLEASE DO NOT APPLY "Poly grip" OR ADHESIVES!!!   Do NOT smoke after Midnight   Stop all vitamins and herbal supplements 7 days before surgery.   Take these medicines the morning of surgery with A SIP OF WATER:  lexapro   DO NOT TAKE ANY ORAL DIABETIC MEDICATIONS DAY OF YOUR SURGERY  Bring CPAP mask and tubing day of surgery.                              You may not have any metal on your body including hair pins, jewelry, and body piercing             Do not wear make-up, lotions, powders, perfumes/cologne, or deodorant  Do not wear nail polish including gel and S&S, artificial/acrylic nails, or any other type of covering on natural nails including finger and toenails. If you have artificial nails, gel coating, etc. that needs to be removed by a nail salon please have this removed prior to surgery or surgery may need to be canceled/ delayed if the surgeon/ anesthesia feels like they are unable to be safely monitored.   Do not shave  48 hours prior to surgery.               Men may shave face and neck.   Do not bring valuables to the hospital. [facility] IS NOT             RESPONSIBLE   FOR VALUABLES.   Contacts, glasses, dentures or bridgework may not be worn into surgery.   Bring small overnight bag day of surgery.   DO NOT BRING YOUR HOME MEDICATIONS TO THE HOSPITAL. PHARMACY WILL DISPENSE MEDICATIONS LISTED ON YOUR MEDICATION  LIST TO YOU DURING YOUR ADMISSION IN THE HOSPITAL!    Patients discharged on the day of surgery will not be allowed to drive home.  Someone NEEDS to stay with you for the first 24 hours after anesthesia.   Special Instructions: Bring a copy of your healthcare power of attorney and living will documents the day of surgery if you haven't scanned them before.              Please read over the following fact sheets you were given: IF YOU HAVE QUESTIONS ABOUT YOUR PRE-OP INSTRUCTIONS PLEASE CALL [phone]   If you received a COVID test during your pre-op visit  it is requested that you wear a mask when out in public, stay away from anyone that may not be feeling well and notify your surgeon if you develop symptoms. If you test positive for Covid or have been in contact with anyone that has tested positive in the last 10 days please notify you surgeon.      Pre-operative 5 CHG Bath Instructions   You can play a key role in reducing the risk of infection after surgery. Your skin needs to be as free of germs as possible. You can reduce the number of germs on your skin by washing with CHG (chlorhexidine gluconate) soap before surgery. CHG is an antiseptic soap that kills germs and continues to kill germs even after washing.   DO NOT use if you have an allergy to chlorhexidine/CHG or antibacterial soaps. If your skin becomes reddened or irritated, stop using the CHG and notify one of our RNs at  [phone].   Please shower with the CHG soap starting 4 days before surgery using the following schedule:     Please keep in mind the following:  DO NOT shave, including legs and underarms, starting the day of your first shower.   You may shave your face at any point before/day of surgery.  Place clean sheets on your bed the day you start using CHG soap. Use a clean washcloth (not used since being washed) for each shower. DO NOT sleep with pets once you start using the CHG.   CHG Shower Instructions:   If you choose to wash your hair and private area, wash first with your normal shampoo/soap.  After you use shampoo/soap, rinse your hair and body thoroughly to remove shampoo/soap residue.  Turn the water OFF and apply about 3 tablespoons (45 ml) of CHG soap to a CLEAN washcloth.  Apply CHG soap ONLY FROM YOUR NECK DOWN TO YOUR TOES (washing for 3-5 minutes)  DO NOT use CHG soap on face, private areas, open wounds, or sores.  Pay special attention to the area where your surgery is being performed.  If you are having back surgery, having someone wash your back for you may be helpful. Wait 2 minutes after CHG soap is applied, then you may rinse off the CHG soap.  Pat dry with a clean towel  Put on clean clothes/pajamas   If you choose to wear lotion, please use ONLY the CHG-compatible lotions on the back of this paper.     Additional instructions for the day of surgery: DO NOT APPLY any lotions, deodorants, cologne, or perfumes.   Put on clean/comfortable clothes.  Brush your teeth.  Ask your nurse before applying any prescription medications to the skin.      CHG Compatible Lotions   Aveeno Moisturizing lotion  Cetaphil Moisturizing Cream  Cetaphil Moisturizing Lotion  Clairol Herbal Essence Moisturizing Lotion, Dry Skin  Clairol Herbal Essence Moisturizing Lotion, Extra Dry Skin  Clairol Herbal Essence Moisturizing Lotion, Normal Skin  Curel Age Defying Therapeutic Moisturizing Lotion with Alpha Hydroxy  Curel Extreme Care Body Lotion  Curel Soothing Hands Moisturizing Hand Lotion  Curel Therapeutic Moisturizing Cream, Fragrance-Free  Curel Therapeutic Moisturizing Lotion, Fragrance-Free  Curel Therapeutic Moisturizing Lotion, Original Formula  Eucerin Daily Replenishing Lotion  Eucerin Dry Skin Therapy Plus Alpha Hydroxy Crme  Eucerin Dry Skin Therapy Plus Alpha Hydroxy Lotion  Eucerin Original Crme  Eucerin Original Lotion  Eucerin Plus Crme Eucerin Plus Lotion   Eucerin TriLipid Replenishing Lotion  Keri Anti-Bacterial Hand Lotion  Keri Deep Conditioning Original Lotion Dry Skin Formula Softly Scented  Keri Deep Conditioning Original Lotion, Fragrance Free Sensitive Skin Formula  Keri Lotion Fast Absorbing Fragrance Free Sensitive Skin Formula  Keri Lotion Fast Absorbing Softly Scented Dry Skin Formula  Keri Original Lotion  Keri Skin Renewal Lotion Keri Silky Smooth Lotion  Keri Silky Smooth Sensitive Skin Lotion  Nivea Body Creamy Conditioning Oil  Nivea Body Extra Enriched Teacher, adult education Moisturizing Lotion Nivea Crme  Nivea Skin Firming Lotion  NutraDerm 30 Skin Lotion  NutraDerm Skin Lotion  NutraDerm Therapeutic Skin Cream  NutraDerm Therapeutic Skin Lotion  ProShield Protective Hand Cream  Provon moisturizing lotion

## 2023-11-05 DIAGNOSIS — M25552 Pain in left hip: Secondary | ICD-10-CM | POA: Diagnosis not present

## 2023-11-06 NOTE — Progress Notes (Signed)
Second request for pre op orders in [facility]: Left voicemail for [employer].

## 2023-11-09 ENCOUNTER — Ambulatory Visit: Payer: Self-pay | Admitting: Emergency Medicine

## 2023-11-09 DIAGNOSIS — T84011A Broken internal left hip prosthesis, initial encounter: Secondary | ICD-10-CM

## 2023-11-09 DIAGNOSIS — G8929 Other chronic pain: Secondary | ICD-10-CM

## 2023-11-09 NOTE — H&P (View-Only) (Signed)
 TOTAL HIP REVISION ADMISSION H&P  Patient is admitted for left revision total hip arthroplasty.  Subjective:  Chief Complaint: left hip pain  HPI: Anne Edwards, 63 y.o. 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 The indications for the revision total hip arthroplasty are  abnormal positioning of femoral head component on x-ray imaging with concern for poly wear or poly breakage .  Onset of symptoms was gradual starting 1 years ago with gradually worsening course since that time.  Prior procedures on the left hip include arthroplasty.  Patient currently rates pain in the left hip at 8 out of 10 with activity.  There is night pain, worsening of pain with activity and weight bearing, pain that interfers with activities of daily living, and pain with passive range of motion.  This condition presents safety issues increasing the risk of falls.  There is no current active infection.  Patient Active Problem List   Diagnosis Date Noted   History of colonic polyps 10/13/2023   Incontinence of feces 09/01/2023   Recent change in frequency of bowel movements 09/24/2022   Esophageal dysphagia 08/13/2022   Irritable bowel syndrome with both constipation and diarrhea 07/25/2021   Gastroesophageal reflux disease without esophagitis    Hiatal hernia    Nausea and vomiting 11/26/2020   Abdominal pain 11/26/2020   Absolute anemia 11/25/2018   Iron deficiency anemia 11/24/2018   Past Medical History:  Diagnosis Date   Anxiety    Arthritis    Complication of anesthesia    Slow to wake up   History of hiatal hernia    IDA (iron deficiency anemia) 11/24/2018   Nausea and vomiting 11/26/2020   Pneumonia     Past Surgical History:  Procedure Laterality Date    BIOPSY  09/24/2022   Procedure: BIOPSY;  Surgeon: Dolores Frame, MD;  Location: [facility];  Service: Gastroenterology;;   CHOLECYSTECTOMY     COLONOSCOPY  02/04/2018   Dr. Marcha Solders, 3 polyps between 5-6mm and 3 cm from anal verge, repeat colonoscopy in 5 years   COLONOSCOPY WITH PROPOFOL N/A 09/24/2022   Procedure: COLONOSCOPY WITH PROPOFOL;  Surgeon: Dolores Frame, MD;  Location: [facility];  Service: Gastroenterology;  Laterality: N/A;  915 ASA 2   COLONOSCOPY WITH PROPOFOL N/A 10/13/2023   Procedure: COLONOSCOPY WITH PROPOFOL;  Surgeon: Dolores Frame, MD;  Location: [facility];  Service: Gastroenterology;  Laterality: N/A;  9:45am;asa 2   ESOPHAGEAL MANOMETRY N/A 01/02/2021   Procedure: ESOPHAGEAL MANOMETRY (EM);  Surgeon: Napoleon Form, MD;  Location: [facility];  Service: Endoscopy;  Laterality: N/A;   ESOPHAGOGASTRODUODENOSCOPY (EGD) WITH PROPOFOL N/A 11/28/2020   Castaneda:6cm hh with a few cameron erosions, normal stomach, normal examined duodenum, no specimens collected.   ESOPHAGOGASTRODUODENOSCOPY (EGD) WITH PROPOFOL N/A 09/24/2022   Procedure: ESOPHAGOGASTRODUODENOSCOPY (EGD) WITH PROPOFOL;  Surgeon: Dolores Frame, MD;  Location: [facility];  Service: Gastroenterology;  Laterality: N/A;   EXTRACORPOREAL SHOCK WAVE LITHOTRIPSY Left 11/14/2022   Procedure: EXTRACORPOREAL SHOCK WAVE LITHOTRIPSY (ESWL);  Surgeon: Alfredo Martinez, MD;  Location: [facility];  Service: Urology;  Laterality: Left;   GIVENS CAPSULE STUDY N/A 12/01/2018   Procedure: GIVENS CAPSULE STUDY;  Surgeon: Malissa Hippo, MD;  Location: [facility];  Service: Endoscopy;  Laterality: N/A;  7:30   HEMOSTASIS CLIP PLACEMENT  09/24/2022   Procedure: HEMOSTASIS CLIP PLACEMENT;  Surgeon: Dolores Frame, MD;  Location: [facility];  Service: Gastroenterology;;   JOINT REPLACEMENT Left 07/2020   total L hip   PARTIAL  HYSTERECTOMY     POLYPECTOMY  09/24/2022   Procedure: POLYPECTOMY INTESTINAL;  Surgeon: Dolores Frame, MD;  Location: [facility];  Service: Gastroenterology;;   POLYPECTOMY  10/13/2023   Procedure: POLYPECTOMY;  Surgeon: Dolores Frame, MD;  [facility]: [facility];  Service: Gastroenterology;;   Gaspar Bidding DILATION  09/24/2022   Procedure: Gaspar Bidding DILATION;  Surgeon: Marguerita Merles, Reuel Boom, MD;  Location: [facility];  Service: Gastroenterology;;   Brooke Dare INJECTION  10/13/2023   Procedure: SUBMUCOSAL LIFTING INJECTION;  Surgeon: Dolores Frame, MD;  Location: [facility];  Service: Gastroenterology;;   XI ROBOTIC ASSISTED HIATAL HERNIA REPAIR N/A 02/11/2021   Procedure: XI ROBOTIC ASSISTED HIATAL HERNIA REPAIR WITH NISSAN FUNDOPLICATION;  Surgeon: Quentin Ore, MD;  Location: [facility];  Service: General;  Laterality: N/A;    Current Outpatient Medications  Medication Sig Dispense Refill Last Dose/Taking   acetaminophen (TYLENOL) 650 MG CR tablet Take 1,300 mg by mouth every 8 (eight) hours as needed for pain.      clonazePAM (KLONOPIN) 1 MG tablet Take 1 mg by mouth at bedtime.      cyclobenzaprine (FLEXERIL) 10 MG tablet Take 10 mg by mouth 3 (three) times daily as needed for muscle spasms.      dicyclomine (BENTYL) 10 MG capsule Take 1 capsule (10 mg total) by mouth every 12 (twelve) hours as needed for spasms (abdominal pain). (Patient not taking: Reported on 11/03/2023) 60 capsule 1    escitalopram (LEXAPRO) 20 MG tablet Take 20 mg by mouth daily.      pantoprazole (PROTONIX) 40 MG tablet Take 1 tablet (40 mg total) by mouth 2 (two) times daily. (Patient not taking: Reported on 11/03/2023) 90 tablet 2    No current facility-administered medications for this visit.   No Known Allergies  Social History   Tobacco Use   Smoking status: Never   Smokeless tobacco: Never  Substance Use Topics   Alcohol use: Never    No family  history on file.    Review of Systems  Musculoskeletal:  Positive for arthralgias.  All other systems reviewed and are negative.   Objective:  Physical Exam Constitutional:      General: She is not in acute distress.    Appearance: Normal appearance. She is not ill-appearing.  HENT:     Head: Normocephalic and atraumatic.     Right Ear: External ear normal.     Left Ear: External ear normal.     Nose: Nose normal.     Mouth/Throat:     Mouth: Mucous membranes are moist.     Pharynx: Oropharynx is clear.  Eyes:     Extraocular Movements: Extraocular movements intact.     Conjunctiva/sclera: Conjunctivae normal.  Cardiovascular:     Rate and Rhythm: Normal rate and regular rhythm.     Pulses: Normal pulses.     Heart sounds: Normal heart sounds.  Pulmonary:     Effort: Pulmonary effort is normal.     Breath sounds: Normal breath sounds.  Abdominal:     General: Bowel sounds are normal.     Palpations: Abdomen is soft.     Tenderness: There is no abdominal tenderness.  Musculoskeletal:        General: Tenderness present.     Cervical back: Normal range of motion and neck supple.     Comments: No significant tenderness to palpation, though pain reproducible with resisted hip flexion.  Audible squeaking of hip.No significant swelling.  Well healed lateral incision, otherwise no overlying lesions of area of chief complaint.  Decreased strength and ROM due to elicited pain.  Dorsiflexion and plantarflexion intact.  BLE appear grossly neurovascularly intact.  Gait mildly antalgic.   Skin:    General: Skin is warm and dry.  Neurological:     Mental Status: She is alert and oriented to person, place, and time. Mental status is at baseline.  Psychiatric:        Mood and Affect: Mood normal.        Behavior: Behavior normal.     Vital signs in last 24 hours: @VSRANGES @   Labs:   Estimated body mass index is 32.02 kg/m as calculated from the following:   Height as of  10/13/23: 5\' 2"  (1.575 m).   Weight as of 10/13/23: 79.4 kg.  Imaging Review:  Plain radiographs demonstrate asymmetrical positioning of femoral head with articulating of the metal acetabular component concerning for poly wear or poly breakage of the left hip(s). There is no obvious evidence of loosening of the acetabular cup and femoral stem.The bone quality appears to be fair for age and reported activity level.     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 Revision total hip arthroplasty is deemed medically necessary. The treatment options including medical management, injection therapy, arthroscopy and arthroplasty were discussed at length. The risks and benefits of total hip arthroplasty were presented and reviewed. The risks due to aseptic loosening, infection, stiffness, dislocation/subluxation,  thromboembolic complications and other imponderables were discussed.  Recommend poly liner exchange and possible acetabular cup revision depending on extent of acetabular damage.  The patient acknowledged the explanation, agreed to proceed with the plan and consent was signed. Patient is being admitted for inpatient treatment for surgery, pain control, PT, OT, prophylactic antibiotics, VTE prophylaxis, progressive ambulation and ADL's and discharge planning. The patient is planning to be discharged home with home health services ([facility])

## 2023-11-09 NOTE — H&P (Signed)
TOTAL HIP REVISION ADMISSION H&P  Patient is admitted for left revision total hip arthroplasty.  Subjective:  Chief Complaint: left hip pain  HPI: Anne Edwards, 63 y.o. 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 The indications for the revision total hip arthroplasty are  abnormal positioning of femoral head component on x-ray imaging with concern for poly wear or poly breakage .  Onset of symptoms was gradual starting 1 years ago with gradually worsening course since that time.  Prior procedures on the left hip include arthroplasty.  Patient currently rates pain in the left hip at 8 out of 10 with activity.  There is night pain, worsening of pain with activity and weight bearing, pain that interfers with activities of daily living, and pain with passive range of motion.  This condition presents safety issues increasing the risk of falls.  There is no current active infection.  Patient Active Problem List   Diagnosis Date Noted   History of colonic polyps 10/13/2023   Incontinence of feces 09/01/2023   Recent change in frequency of bowel movements 09/24/2022   Esophageal dysphagia 08/13/2022   Irritable bowel syndrome with both constipation and diarrhea 07/25/2021   Gastroesophageal reflux disease without esophagitis    Hiatal hernia    Nausea and vomiting 11/26/2020   Abdominal pain 11/26/2020   Absolute anemia 11/25/2018   Iron deficiency anemia 11/24/2018   Past Medical History:  Diagnosis Date   Anxiety    Arthritis    Complication of anesthesia    Slow to wake up   History of hiatal hernia    IDA (iron deficiency anemia) 11/24/2018   Nausea and vomiting 11/26/2020   Pneumonia     Past Surgical History:  Procedure Laterality Date    BIOPSY  09/24/2022   Procedure: BIOPSY;  Surgeon: Dolores Frame, MD;  Location: [facility];  Service: Gastroenterology;;   CHOLECYSTECTOMY     COLONOSCOPY  02/04/2018   Dr. Marcha Solders, 3 polyps between 5-6mm and 3 cm from anal verge, repeat colonoscopy in 5 years   COLONOSCOPY WITH PROPOFOL N/A 09/24/2022   Procedure: COLONOSCOPY WITH PROPOFOL;  Surgeon: Dolores Frame, MD;  Location: [facility];  Service: Gastroenterology;  Laterality: N/A;  915 ASA 2   COLONOSCOPY WITH PROPOFOL N/A 10/13/2023   Procedure: COLONOSCOPY WITH PROPOFOL;  Surgeon: Dolores Frame, MD;  [facility]: [facility];  Service: Gastroenterology;  Laterality: N/A;  9:45am;asa 2   ESOPHAGEAL MANOMETRY N/A 01/02/2021   Procedure: ESOPHAGEAL MANOMETRY (EM);  Surgeon: Napoleon Form, MD;  Location: [facility];  Service: Endoscopy;  Laterality: N/A;   ESOPHAGOGASTRODUODENOSCOPY (EGD) WITH PROPOFOL N/A 11/28/2020   Castaneda:6cm hh with a few cameron erosions, normal stomach, normal examined duodenum, no specimens collected.   ESOPHAGOGASTRODUODENOSCOPY (EGD) WITH PROPOFOL N/A 09/24/2022   Procedure: ESOPHAGOGASTRODUODENOSCOPY (EGD) WITH PROPOFOL;  Surgeon: Dolores Frame, MD;  [facility]: [facility];  Service: Gastroenterology;  Laterality: N/A;   EXTRACORPOREAL SHOCK WAVE LITHOTRIPSY Left 11/14/2022   Procedure: EXTRACORPOREAL SHOCK WAVE LITHOTRIPSY (ESWL);  Surgeon: Alfredo Martinez, MD;  Location: [facility];  Service: Urology;  Laterality: Left;   GIVENS CAPSULE STUDY N/A 12/01/2018   Procedure: GIVENS CAPSULE STUDY;  Surgeon: Malissa Hippo, MD;  Location: [facility];  Service: Endoscopy;  Laterality: N/A;  7:30   HEMOSTASIS CLIP PLACEMENT  09/24/2022   Procedure: HEMOSTASIS CLIP PLACEMENT;  Surgeon: Dolores Frame, MD;  [facility]: [facility];  Service: Gastroenterology;;   JOINT REPLACEMENT Left 07/2020   total L hip   PARTIAL  HYSTERECTOMY     POLYPECTOMY  09/24/2022   Procedure: POLYPECTOMY INTESTINAL;  Surgeon: Dolores Frame, MD;  [facility]: [facility];  Service: Gastroenterology;;   POLYPECTOMY  10/13/2023   Procedure: POLYPECTOMY;  Surgeon: Dolores Frame, MD;  [facility]: [facility];  Service: Gastroenterology;;   Gaspar Bidding DILATION  09/24/2022   Procedure: Gaspar Bidding DILATION;  Surgeon: Marguerita Merles, Reuel Boom, MD;  [facility]: [facility];  Service: Gastroenterology;;   Brooke Dare INJECTION  10/13/2023   Procedure: SUBMUCOSAL LIFTING INJECTION;  Surgeon: Dolores Frame, MD;  [facility]: [facility];  Service: Gastroenterology;;   XI ROBOTIC ASSISTED HIATAL HERNIA REPAIR N/A 02/11/2021   Procedure: XI ROBOTIC ASSISTED HIATAL HERNIA REPAIR WITH NISSAN FUNDOPLICATION;  Surgeon: Quentin Ore, MD;  Location: [facility];  Service: General;  Laterality: N/A;    Current Outpatient Medications  Medication Sig Dispense Refill Last Dose/Taking   acetaminophen (TYLENOL) 650 MG CR tablet Take 1,300 mg by mouth every 8 (eight) hours as needed for pain.      clonazePAM (KLONOPIN) 1 MG tablet Take 1 mg by mouth at bedtime.      cyclobenzaprine (FLEXERIL) 10 MG tablet Take 10 mg by mouth 3 (three) times daily as needed for muscle spasms.      dicyclomine (BENTYL) 10 MG capsule Take 1 capsule (10 mg total) by mouth every 12 (twelve) hours as needed for spasms (abdominal pain). (Patient not taking: Reported on 11/03/2023) 60 capsule 1    escitalopram (LEXAPRO) 20 MG tablet Take 20 mg by mouth daily.      pantoprazole (PROTONIX) 40 MG tablet Take 1 tablet (40 mg total) by mouth 2 (two) times daily. (Patient not taking: Reported on 11/03/2023) 90 tablet 2    No current facility-administered medications for this visit.   No Known Allergies  Social History   Tobacco Use   Smoking status: Never   Smokeless tobacco: Never  Substance Use Topics   Alcohol use: Never    No family  history on file.    Review of Systems  Musculoskeletal:  Positive for arthralgias.  All other systems reviewed and are negative.   Objective:  Physical Exam Constitutional:      General: She is not in acute distress.    Appearance: Normal appearance. She is not ill-appearing.  HENT:     Head: Normocephalic and atraumatic.     Right Ear: External ear normal.     Left Ear: External ear normal.     Nose: Nose normal.     Mouth/Throat:     Mouth: Mucous membranes are moist.     Pharynx: Oropharynx is clear.  Eyes:     Extraocular Movements: Extraocular movements intact.     Conjunctiva/sclera: Conjunctivae normal.  Cardiovascular:     Rate and Rhythm: Normal rate and regular rhythm.     Pulses: Normal pulses.     Heart sounds: Normal heart sounds.  Pulmonary:     Effort: Pulmonary effort is normal.     Breath sounds: Normal breath sounds.  Abdominal:     General: Bowel sounds are normal.     Palpations: Abdomen is soft.     Tenderness: There is no abdominal tenderness.  Musculoskeletal:        General: Tenderness present.     Cervical back: Normal range of motion and neck supple.     Comments: No significant tenderness to palpation, though pain reproducible with resisted hip flexion.  Audible squeaking of hip.No significant swelling.  Well healed lateral incision, otherwise no overlying lesions of area of chief complaint.  Decreased strength and ROM due to elicited pain.  Dorsiflexion and plantarflexion intact.  BLE appear grossly neurovascularly intact.  Gait mildly antalgic.   Skin:    General: Skin is warm and dry.  Neurological:     Mental Status: She is alert and oriented to person, place, and time. Mental status is at baseline.  Psychiatric:        Mood and Affect: Mood normal.        Behavior: Behavior normal.     Vital signs in last 24 hours: @VSRANGES @   Labs:   Estimated body mass index is 32.02 kg/m as calculated from the following:   Height as of  10/13/23: 5\' 2"  (1.575 m).   Weight as of 10/13/23: 79.4 kg.  Imaging Review:  Plain radiographs demonstrate asymmetrical positioning of femoral head with articulating of the metal acetabular component concerning for poly wear or poly breakage of the left hip(s). There is no obvious evidence of loosening of the acetabular cup and femoral stem.The bone quality appears to be fair for age and reported activity level.     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 Revision total hip arthroplasty is deemed medically necessary. The treatment options including medical management, injection therapy, arthroscopy and arthroplasty were discussed at length. The risks and benefits of total hip arthroplasty were presented and reviewed. The risks due to aseptic loosening, infection, stiffness, dislocation/subluxation,  thromboembolic complications and other imponderables were discussed.  Recommend poly liner exchange and possible acetabular cup revision depending on extent of acetabular damage.  The patient acknowledged the explanation, agreed to proceed with the plan and consent was signed. Patient is being admitted for inpatient treatment for surgery, pain control, PT, OT, prophylactic antibiotics, VTE prophylaxis, progressive ambulation and ADL's and discharge planning. The patient is planning to be discharged home with home health services ([facility])

## 2023-11-10 ENCOUNTER — Ambulatory Visit
Admission: RE | Admit: 2023-11-10 | Discharge: 2023-11-10 | Disposition: A | Discharge status: Home / Self Care | Payer: Medicare HMO | Source: Ambulatory Visit | Attending: Orthopedic Surgery | Admitting: Orthopedic Surgery

## 2023-11-10 ENCOUNTER — Encounter (HOSPITAL_COMMUNITY): Payer: Self-pay

## 2023-11-10 ENCOUNTER — Encounter (HOSPITAL_COMMUNITY)
Admission: RE | Admit: 2023-11-10 | Discharge: 2023-11-10 | Disposition: A | Discharge status: Home / Self Care | Payer: Medicare HMO | Source: Ambulatory Visit | Attending: Orthopedic Surgery | Admitting: Orthopedic Surgery

## 2023-11-10 ENCOUNTER — Other Ambulatory Visit: Payer: Self-pay

## 2023-11-10 VITALS — BP 161/79 | HR 82 | Temp 98.1°F | Ht 62.0 in | Wt 177.0 lb

## 2023-11-10 DIAGNOSIS — M25552 Pain in left hip: Secondary | ICD-10-CM | POA: Insufficient documentation

## 2023-11-10 DIAGNOSIS — T84011A Broken internal left hip prosthesis, initial encounter: Secondary | ICD-10-CM | POA: Insufficient documentation

## 2023-11-10 DIAGNOSIS — X58XXXA Exposure to other specified factors, initial encounter: Secondary | ICD-10-CM | POA: Diagnosis not present

## 2023-11-10 DIAGNOSIS — G8929 Other chronic pain: Secondary | ICD-10-CM | POA: Diagnosis not present

## 2023-11-10 DIAGNOSIS — Z01812 Encounter for preprocedural laboratory examination: Secondary | ICD-10-CM | POA: Diagnosis not present

## 2023-11-10 DIAGNOSIS — T84031A Mechanical loosening of internal left hip prosthetic joint, initial encounter: Secondary | ICD-10-CM

## 2023-11-10 DIAGNOSIS — Z01818 Encounter for other preprocedural examination: Secondary | ICD-10-CM

## 2023-11-10 DIAGNOSIS — Z96642 Presence of left artificial hip joint: Secondary | ICD-10-CM | POA: Diagnosis not present

## 2023-11-10 LAB — COMPREHENSIVE METABOLIC PANEL
ALT: 16 U/L (ref 0–44)
AST: 17 U/L (ref 15–41)
Albumin: 4 g/dL (ref 3.5–5.0)
Alkaline Phosphatase: 104 U/L (ref 38–126)
Anion gap: 9 (ref 5–15)
BUN: 14 mg/dL (ref 8–23)
CO2: 25 mmol/L (ref 22–32)
Calcium: 8.9 mg/dL (ref 8.9–10.3)
Chloride: 106 mmol/L (ref 98–111)
Creatinine, Ser: 0.52 mg/dL (ref 0.44–1.00)
GFR, Estimated: >60 mL/min (ref >60)
Glucose, Bld: 94 mg/dL (ref 70–99)
Potassium: 3.9 mmol/L (ref 3.5–5.1)
Sodium: 140 mmol/L (ref 135–145)
Total Bilirubin: 0.8 mg/dL (ref 0.0–1.2)
Total Protein: 6.9 g/dL (ref 6.5–8.1)

## 2023-11-10 LAB — CBC WITH DIFFERENTIAL/PLATELET
Abs Immature Granulocytes: 0.01 10*3/uL (ref 0.00–0.07)
Basophils Absolute: 0.1 10*3/uL (ref 0.0–0.1)
Basophils Relative: 1 %
Eosinophils Absolute: 0.3 10*3/uL (ref 0.0–0.5)
Eosinophils Relative: 4 %
HCT: 42.2 % (ref 36.0–46.0)
Hemoglobin: 13.6 g/dL (ref 12.0–15.0)
Immature Granulocytes: 0 %
Lymphocytes Relative: 29 %
Lymphs Abs: 1.9 10*3/uL (ref 0.7–4.0)
MCH: 27.4 pg (ref 26.0–34.0)
MCHC: 32.2 g/dL (ref 30.0–36.0)
MCV: 85.1 fL (ref 80.0–100.0)
Monocytes Absolute: 0.6 10*3/uL (ref 0.1–1.0)
Monocytes Relative: 9 %
Neutro Abs: 3.7 10*3/uL (ref 1.7–7.7)
Neutrophils Relative %: 57 %
Platelets: 221 10*3/uL (ref 150–400)
RBC: 4.96 MIL/uL (ref 3.87–5.11)
RDW: 13.3 % (ref 11.5–15.5)
WBC: 6.5 10*3/uL (ref 4.0–10.5)
nRBC: 0 % (ref 0.0–0.2)

## 2023-11-10 LAB — SURGICAL PCR SCREEN
MRSA, PCR: NEGATIVE
Staphylococcus aureus: NEGATIVE

## 2023-11-10 NOTE — Progress Notes (Signed)
For Anesthesia: PCP - Royann Shivers, PA-C  Cardiologist - N/A  Bowel Prep reminder:  Chest x-ray -  EKG -  Stress Test -  ECHO -  Cardiac Cath -  Pacemaker/ICD device last checked: Pacemaker orders received: Device Rep notified:  Spinal Cord Stimulator:  Sleep Study - N/A CPAP -   Fasting Blood Sugar - N/A Checks Blood Sugar _____ times a day Date and result of last Hgb A1c-  Last dose of GLP1 agonist- N/A GLP1 instructions:   Last dose of SGLT-2 inhibitors- N/A SGLT-2 instructions:   Blood Thinner Instructions:N/A Aspirin Instructions: Last Dose:  Activity level: Can go up a flight of stairs and activities of daily living without stopping and without chest pain and/or shortness of breath   Able to exercise without chest pain and/or shortness of breath  Anesthesia review:   Patient denies shortness of breath, fever, cough and chest pain at PAT appointment   Patient verbalized understanding of instructions that were given to them at the PAT appointment. Patient was also instructed that they will need to review over the PAT instructions again at home before surgery.

## 2023-11-17 ENCOUNTER — Encounter (HOSPITAL_COMMUNITY): Payer: Medicare HMO

## 2023-11-18 ENCOUNTER — Inpatient Hospital Stay (HOSPITAL_COMMUNITY)
Admission: RE | Admit: 2023-11-18 | Discharge: 2023-11-19 | DRG: 468 | Disposition: A | Discharge status: Home Health | Payer: Medicare HMO | Attending: Orthopedic Surgery | Admitting: Orthopedic Surgery

## 2023-11-18 ENCOUNTER — Encounter (HOSPITAL_COMMUNITY)
Admission: RE | Disposition: A | Discharge status: Home Health | Payer: Self-pay | Source: Home / Self Care | Attending: Orthopedic Surgery

## 2023-11-18 ENCOUNTER — Encounter (HOSPITAL_COMMUNITY): Payer: Self-pay | Admitting: Orthopedic Surgery

## 2023-11-18 ENCOUNTER — Inpatient Hospital Stay (HOSPITAL_COMMUNITY): Payer: Self-pay | Admitting: Anesthesiology

## 2023-11-18 ENCOUNTER — Other Ambulatory Visit: Payer: Self-pay

## 2023-11-18 ENCOUNTER — Inpatient Hospital Stay (HOSPITAL_COMMUNITY): Payer: Medicare HMO

## 2023-11-18 DIAGNOSIS — K219 Gastro-esophageal reflux disease without esophagitis: Secondary | ICD-10-CM | POA: Diagnosis not present

## 2023-11-18 DIAGNOSIS — T84011A Broken internal left hip prosthesis, initial encounter: Principal | ICD-10-CM | POA: Diagnosis present

## 2023-11-18 DIAGNOSIS — T84018A Broken internal joint prosthesis, other site, initial encounter: Principal | ICD-10-CM

## 2023-11-18 DIAGNOSIS — Z8701 Personal history of pneumonia (recurrent): Secondary | ICD-10-CM | POA: Diagnosis not present

## 2023-11-18 DIAGNOSIS — Z9181 History of falling: Secondary | ICD-10-CM | POA: Diagnosis not present

## 2023-11-18 DIAGNOSIS — T8484XA Pain due to internal orthopedic prosthetic devices, implants and grafts, initial encounter: Secondary | ICD-10-CM | POA: Diagnosis not present

## 2023-11-18 DIAGNOSIS — T84091A Other mechanical complication of internal left hip prosthesis, initial encounter: Secondary | ICD-10-CM | POA: Diagnosis not present

## 2023-11-18 DIAGNOSIS — Z79899 Other long term (current) drug therapy: Secondary | ICD-10-CM

## 2023-11-18 DIAGNOSIS — T8189XA Other complications of procedures, not elsewhere classified, initial encounter: Secondary | ICD-10-CM | POA: Diagnosis not present

## 2023-11-18 DIAGNOSIS — Z471 Aftercare following joint replacement surgery: Secondary | ICD-10-CM | POA: Diagnosis not present

## 2023-11-18 DIAGNOSIS — Z01818 Encounter for other preprocedural examination: Secondary | ICD-10-CM

## 2023-11-18 DIAGNOSIS — Y792 Prosthetic and other implants, materials and accessory orthopedic devices associated with adverse incidents: Secondary | ICD-10-CM | POA: Diagnosis present

## 2023-11-18 DIAGNOSIS — Z860100 Personal history of colon polyps, unspecified: Secondary | ICD-10-CM

## 2023-11-18 DIAGNOSIS — Z96642 Presence of left artificial hip joint: Secondary | ICD-10-CM | POA: Diagnosis not present

## 2023-11-18 DIAGNOSIS — F419 Anxiety disorder, unspecified: Secondary | ICD-10-CM | POA: Diagnosis not present

## 2023-11-18 DIAGNOSIS — T84031A Mechanical loosening of internal left hip prosthetic joint, initial encounter: Secondary | ICD-10-CM | POA: Diagnosis not present

## 2023-11-18 DIAGNOSIS — Z96649 Presence of unspecified artificial hip joint: Principal | ICD-10-CM

## 2023-11-18 DIAGNOSIS — Z90711 Acquired absence of uterus with remaining cervical stump: Secondary | ICD-10-CM | POA: Diagnosis not present

## 2023-11-18 HISTORY — PX: TOTAL HIP REVISION: SHX763

## 2023-11-18 HISTORY — PX: APPLICATION OF WOUND VAC: SHX5189

## 2023-11-18 LAB — TYPE AND SCREEN
ABO/RH(D): O POS
Antibody Screen: NEGATIVE

## 2023-11-18 SURGERY — TOTAL HIP REVISION
Anesthesia: General | Site: Hip | Laterality: Left

## 2023-11-18 MED ORDER — KETOROLAC TROMETHAMINE 15 MG/ML IJ SOLN
15.0000 mg | Freq: Four times a day (QID) | INTRAMUSCULAR | Status: AC
  Administered 2023-11-18 – 2023-11-19 (×4): 15 mg via INTRAVENOUS
  Filled 2023-11-18 (×4): qty 1

## 2023-11-18 MED ORDER — CLONAZEPAM 1 MG PO TABS
1.0000 mg | ORAL_TABLET | Freq: Every day | ORAL | Status: DC
  Administered 2023-11-18: 1 mg via ORAL
  Filled 2023-11-18: qty 1

## 2023-11-18 MED ORDER — ORAL CARE MOUTH RINSE: 15.0000 mL | Freq: Once | OROMUCOSAL | Status: AC

## 2023-11-18 MED ORDER — BUPIVACAINE LIPOSOME 1.3 % IJ SUSP
INTRAMUSCULAR | Status: AC
  Filled 2023-11-18: qty 20

## 2023-11-18 MED ORDER — ONDANSETRON HCL 4 MG PO TABS: 4.0000 mg | ORAL_TABLET | Freq: Three times a day (TID) | ORAL | 0 refills | Status: AC | PRN

## 2023-11-18 MED ORDER — HYDROMORPHONE HCL 1 MG/ML IJ SOLN
INTRAMUSCULAR | Status: AC
  Filled 2023-11-18: qty 1

## 2023-11-18 MED ORDER — OXYCODONE HCL 5 MG PO TABS: 5.0000 mg | ORAL_TABLET | ORAL | 0 refills | Status: AC | PRN

## 2023-11-18 MED ORDER — SENNA 8.6 MG PO TABS
1.0000 | ORAL_TABLET | Freq: Two times a day (BID) | ORAL | Status: DC
  Administered 2023-11-18 – 2023-11-19 (×2): 8.6 mg via ORAL
  Filled 2023-11-18 (×2): qty 1

## 2023-11-18 MED ORDER — LACTATED RINGERS IV SOLN: INTRAVENOUS | Status: AC

## 2023-11-18 MED ORDER — CYCLOBENZAPRINE HCL 10 MG PO TABS: 10.0000 mg | ORAL_TABLET | Freq: Three times a day (TID) | ORAL | Status: DC | PRN

## 2023-11-18 MED ORDER — DEXAMETHASONE SODIUM PHOSPHATE 10 MG/ML IJ SOLN
8.0000 mg | Freq: Once | INTRAMUSCULAR | Status: AC
  Administered 2023-11-18: 8 mg via INTRAVENOUS

## 2023-11-18 MED ORDER — LIDOCAINE 2% (20 MG/ML) 5 ML SYRINGE
INTRAMUSCULAR | Status: DC | PRN
  Administered 2023-11-18: 60 mg via INTRAVENOUS

## 2023-11-18 MED ORDER — METHOCARBAMOL 1000 MG/10ML IJ SOLN: 500.0000 mg | Freq: Four times a day (QID) | INTRAMUSCULAR | Status: DC | PRN

## 2023-11-18 MED ORDER — PHENOL 1.4 % MT LIQD: 1.0000 | OROMUCOSAL | Status: DC | PRN

## 2023-11-18 MED ORDER — DOCUSATE SODIUM 100 MG PO CAPS
100.0000 mg | ORAL_CAPSULE | Freq: Two times a day (BID) | ORAL | Status: DC
  Administered 2023-11-18 – 2023-11-19 (×2): 100 mg via ORAL
  Filled 2023-11-18 (×2): qty 1

## 2023-11-18 MED ORDER — FENTANYL CITRATE (PF) 100 MCG/2ML IJ SOLN
INTRAMUSCULAR | Status: AC
  Filled 2023-11-18: qty 2

## 2023-11-18 MED ORDER — ACETAMINOPHEN 500 MG PO TABS: 1000.0000 mg | ORAL_TABLET | Freq: Three times a day (TID) | ORAL | Status: AC | PRN

## 2023-11-18 MED ORDER — ASPIRIN 81 MG PO CHEW
81.0000 mg | CHEWABLE_TABLET | Freq: Two times a day (BID) | ORAL | Status: DC
  Administered 2023-11-18 – 2023-11-19 (×2): 81 mg via ORAL
  Filled 2023-11-18 (×2): qty 1

## 2023-11-18 MED ORDER — OXYCODONE HCL 5 MG PO TABS
5.0000 mg | ORAL_TABLET | ORAL | Status: DC | PRN
Start: 2023-11-18 — End: 2023-11-19
  Administered 2023-11-18 – 2023-11-19 (×2): 10 mg via ORAL
  Filled 2023-11-18 (×2): qty 2

## 2023-11-18 MED ORDER — DICYCLOMINE HCL 10 MG PO CAPS: 10.0000 mg | ORAL_CAPSULE | Freq: Two times a day (BID) | ORAL | Status: DC | PRN

## 2023-11-18 MED ORDER — FENTANYL CITRATE (PF) 100 MCG/2ML IJ SOLN
INTRAMUSCULAR | Status: DC | PRN
  Administered 2023-11-18: 100 ug via INTRAVENOUS

## 2023-11-18 MED ORDER — HYDROMORPHONE HCL 1 MG/ML IJ SOLN
0.2500 mg | INTRAMUSCULAR | Status: DC | PRN
  Administered 2023-11-18 (×3): 0.5 mg via INTRAVENOUS

## 2023-11-18 MED ORDER — PROPOFOL 1000 MG/100ML IV EMUL
INTRAVENOUS | Status: AC
  Filled 2023-11-18: qty 100

## 2023-11-18 MED ORDER — BUPIVACAINE-EPINEPHRINE 0.25% -1:200000 IJ SOLN
INTRAMUSCULAR | Status: AC
  Filled 2023-11-18: qty 1

## 2023-11-18 MED ORDER — PROPOFOL 10 MG/ML IV BOLUS
INTRAVENOUS | Status: DC | PRN
  Administered 2023-11-18: 30 mg via INTRAVENOUS
  Administered 2023-11-18: 170 mg via INTRAVENOUS

## 2023-11-18 MED ORDER — CELECOXIB 100 MG PO CAPS: 100.0000 mg | ORAL_CAPSULE | Freq: Two times a day (BID) | ORAL | 0 refills | Status: AC

## 2023-11-18 MED ORDER — CEFAZOLIN SODIUM-DEXTROSE 2-4 GM/100ML-% IV SOLN
2.0000 g | INTRAVENOUS | Status: AC
  Administered 2023-11-18: 2 g via INTRAVENOUS
  Filled 2023-11-18: qty 100

## 2023-11-18 MED ORDER — POVIDONE-IODINE 10 % EX SWAB: 2.0000 | Freq: Once | CUTANEOUS | Status: DC

## 2023-11-18 MED ORDER — ESCITALOPRAM OXALATE 20 MG PO TABS
20.0000 mg | ORAL_TABLET | Freq: Every day | ORAL | Status: DC
Start: 2023-11-19 — End: 2023-11-19
  Administered 2023-11-19: 20 mg via ORAL
  Filled 2023-11-18: qty 1

## 2023-11-18 MED ORDER — ONDANSETRON HCL 4 MG PO TABS: 4.0000 mg | ORAL_TABLET | Freq: Four times a day (QID) | ORAL | Status: DC | PRN

## 2023-11-18 MED ORDER — SUCCINYLCHOLINE CHLORIDE 200 MG/10ML IV SOSY
PREFILLED_SYRINGE | INTRAVENOUS | Status: DC | PRN
  Administered 2023-11-18: 120 mg via INTRAVENOUS

## 2023-11-18 MED ORDER — LACTATED RINGERS IV SOLN
INTRAVENOUS | Status: DC
Start: 2023-11-18 — End: 2023-11-18

## 2023-11-18 MED ORDER — CEFAZOLIN SODIUM-DEXTROSE 2-4 GM/100ML-% IV SOLN
2.0000 g | Freq: Three times a day (TID) | INTRAVENOUS | Status: DC
  Administered 2023-11-18 – 2023-11-19 (×3): 2 g via INTRAVENOUS
  Filled 2023-11-18 (×3): qty 100

## 2023-11-18 MED ORDER — POLYETHYLENE GLYCOL 3350 17 G PO PACK: 17.0000 g | PACK | Freq: Every day | ORAL | 0 refills | Status: DC

## 2023-11-18 MED ORDER — CEFADROXIL 500 MG PO CAPS: 500.0000 mg | ORAL_CAPSULE | Freq: Two times a day (BID) | ORAL | 0 refills | Status: AC

## 2023-11-18 MED ORDER — DEXMEDETOMIDINE HCL IN NACL 80 MCG/20ML IV SOLN
INTRAVENOUS | Status: DC | PRN
  Administered 2023-11-18 (×2): 4 ug via INTRAVENOUS

## 2023-11-18 MED ORDER — PROPOFOL 10 MG/ML IV BOLUS
INTRAVENOUS | Status: AC
  Filled 2023-11-18: qty 20

## 2023-11-18 MED ORDER — SODIUM CHLORIDE (PF) 0.9 % IJ SOLN
INTRAMUSCULAR | Status: AC
  Filled 2023-11-18: qty 50

## 2023-11-18 MED ORDER — SUGAMMADEX SODIUM 200 MG/2ML IV SOLN
INTRAVENOUS | Status: DC | PRN
  Administered 2023-11-18: 200 mg via INTRAVENOUS

## 2023-11-18 MED ORDER — ASPIRIN 81 MG PO TBEC: 81.0000 mg | DELAYED_RELEASE_TABLET | Freq: Two times a day (BID) | ORAL | Status: AC

## 2023-11-18 MED ORDER — DEXAMETHASONE SODIUM PHOSPHATE 4 MG/ML IJ SOLN
INTRAMUSCULAR | Status: DC | PRN
  Administered 2023-11-18: 8 mg via INTRAVENOUS

## 2023-11-18 MED ORDER — DIPHENHYDRAMINE HCL 12.5 MG/5ML PO ELIX: 12.5000 mg | ORAL_SOLUTION | ORAL | Status: DC | PRN

## 2023-11-18 MED ORDER — MIDAZOLAM HCL 2 MG/2ML IJ SOLN
INTRAMUSCULAR | Status: DC | PRN
  Administered 2023-11-18 (×2): 1 mg via INTRAVENOUS

## 2023-11-18 MED ORDER — MIDAZOLAM HCL 2 MG/2ML IJ SOLN
INTRAMUSCULAR | Status: AC
  Filled 2023-11-18: qty 2

## 2023-11-18 MED ORDER — SODIUM CHLORIDE (PF) 0.9 % IJ SOLN
INTRAMUSCULAR | Status: DC | PRN
  Administered 2023-11-18: 80 mL

## 2023-11-18 MED ORDER — TRANEXAMIC ACID-NACL 1000-0.7 MG/100ML-% IV SOLN
1000.0000 mg | INTRAVENOUS | Status: AC
  Administered 2023-11-18: 1000 mg via INTRAVENOUS
  Filled 2023-11-18: qty 100

## 2023-11-18 MED ORDER — SODIUM CHLORIDE 0.9 % IV SOLN: INTRAVENOUS | Status: DC

## 2023-11-18 MED ORDER — LIDOCAINE HCL (PF) 2 % IJ SOLN
INTRAMUSCULAR | Status: AC
  Filled 2023-11-18: qty 5

## 2023-11-18 MED ORDER — BUPIVACAINE LIPOSOME 1.3 % IJ SUSP: 10.0000 mL | Freq: Once | INTRAMUSCULAR | Status: DC

## 2023-11-18 MED ORDER — WATER FOR IRRIGATION, STERILE IR SOLN
Status: DC | PRN
  Administered 2023-11-18: 1000 mL

## 2023-11-18 MED ORDER — ONDANSETRON HCL 4 MG/2ML IJ SOLN
4.0000 mg | Freq: Four times a day (QID) | INTRAMUSCULAR | Status: DC | PRN
Start: 2023-11-18 — End: 2023-11-19
  Administered 2023-11-19: 4 mg via INTRAVENOUS
  Filled 2023-11-18: qty 2

## 2023-11-18 MED ORDER — ONDANSETRON HCL 4 MG/2ML IJ SOLN
INTRAMUSCULAR | Status: DC | PRN
  Administered 2023-11-18: 4 mg via INTRAVENOUS

## 2023-11-18 MED ORDER — 0.9 % SODIUM CHLORIDE (POUR BTL) OPTIME
TOPICAL | Status: DC | PRN
  Administered 2023-11-18: 1000 mL

## 2023-11-18 MED ORDER — ONDANSETRON HCL 4 MG/2ML IJ SOLN
INTRAMUSCULAR | Status: AC
  Filled 2023-11-18: qty 2

## 2023-11-18 MED ORDER — ACETAMINOPHEN 10 MG/ML IV SOLN
1000.0000 mg | Freq: Once | INTRAVENOUS | Status: DC | PRN
  Administered 2023-11-18: 1000 mg via INTRAVENOUS

## 2023-11-18 MED ORDER — CHLORHEXIDINE GLUCONATE 0.12 % MT SOLN
15.0000 mL | Freq: Once | OROMUCOSAL | Status: AC
  Administered 2023-11-18: 15 mL via OROMUCOSAL

## 2023-11-18 MED ORDER — HYDROMORPHONE HCL 1 MG/ML IJ SOLN: 0.5000 mg | INTRAMUSCULAR | Status: DC | PRN

## 2023-11-18 MED ORDER — ACETAMINOPHEN 500 MG PO TABS
1000.0000 mg | ORAL_TABLET | Freq: Four times a day (QID) | ORAL | Status: DC
  Administered 2023-11-19 (×3): 1000 mg via ORAL
  Filled 2023-11-18 (×3): qty 2

## 2023-11-18 MED ORDER — ACETAMINOPHEN 325 MG PO TABS
325.0000 mg | ORAL_TABLET | Freq: Four times a day (QID) | ORAL | Status: DC | PRN
Start: 2023-11-19 — End: 2023-11-19

## 2023-11-18 MED ORDER — ACETAMINOPHEN 500 MG PO TABS
1000.0000 mg | ORAL_TABLET | Freq: Once | ORAL | Status: AC
  Administered 2023-11-18: 1000 mg via ORAL
  Filled 2023-11-18: qty 2

## 2023-11-18 MED ORDER — DROPERIDOL 2.5 MG/ML IJ SOLN: 0.6250 mg | Freq: Once | INTRAMUSCULAR | Status: DC | PRN

## 2023-11-18 MED ORDER — SODIUM CHLORIDE 0.9 % IR SOLN
Status: DC | PRN
  Administered 2023-11-18: 250 mL
  Administered 2023-11-18: 1000 mL

## 2023-11-18 MED ORDER — CYCLOBENZAPRINE HCL 10 MG PO TABS: 10.0000 mg | ORAL_TABLET | Freq: Three times a day (TID) | ORAL | 0 refills | Status: DC | PRN

## 2023-11-18 MED ORDER — DEXMEDETOMIDINE HCL IN NACL 80 MCG/20ML IV SOLN
INTRAVENOUS | Status: AC
  Filled 2023-11-18: qty 20

## 2023-11-18 MED ORDER — MENTHOL 3 MG MT LOZG: 1.0000 | LOZENGE | OROMUCOSAL | Status: DC | PRN

## 2023-11-18 MED ORDER — METHOCARBAMOL 500 MG PO TABS
500.0000 mg | ORAL_TABLET | Freq: Four times a day (QID) | ORAL | Status: DC | PRN
Start: 2023-11-18 — End: 2023-11-19

## 2023-11-18 MED ORDER — PANTOPRAZOLE SODIUM 40 MG PO TBEC
40.0000 mg | DELAYED_RELEASE_TABLET | Freq: Every day | ORAL | Status: DC
  Administered 2023-11-18 – 2023-11-19 (×2): 40 mg via ORAL
  Filled 2023-11-18 (×2): qty 1

## 2023-11-18 MED ORDER — ACETAMINOPHEN 10 MG/ML IV SOLN
INTRAVENOUS | Status: AC
  Filled 2023-11-18: qty 100

## 2023-11-18 MED ORDER — POLYETHYLENE GLYCOL 3350 17 G PO PACK: 17.0000 g | PACK | Freq: Every day | ORAL | Status: DC | PRN

## 2023-11-18 MED ORDER — EPHEDRINE SULFATE-NACL 50-0.9 MG/10ML-% IV SOSY
PREFILLED_SYRINGE | INTRAVENOUS | Status: DC | PRN
  Administered 2023-11-18 (×2): 5 mg via INTRAVENOUS

## 2023-11-18 MED ORDER — ROCURONIUM BROMIDE 10 MG/ML (PF) SYRINGE
PREFILLED_SYRINGE | INTRAVENOUS | Status: DC | PRN
  Administered 2023-11-18: 10 mg via INTRAVENOUS
  Administered 2023-11-18: 50 mg via INTRAVENOUS

## 2023-11-18 MED ORDER — DEXAMETHASONE SODIUM PHOSPHATE 10 MG/ML IJ SOLN
INTRAMUSCULAR | Status: AC
  Filled 2023-11-18: qty 1

## 2023-11-18 SURGICAL SUPPLY — 66 items
BAG COUNTER SPONGE SURGICOUNT (BAG) IMPLANT
BAG DECANTER FOR FLEXI CONT (MISCELLANEOUS) ×2 IMPLANT
BAG ZIPLOCK 12X15 (MISCELLANEOUS) ×2 IMPLANT
BALL HIP CERAMIC 32MM PLUS 1 IMPLANT
BLADE SAW SAG 25X90X1.19 (BLADE) IMPLANT
BLADE SAW SGTL 81X20 HD (BLADE) ×2 IMPLANT
CHLORAPREP W/TINT 26 (MISCELLANEOUS) ×4 IMPLANT
CNTNR URN SCR LID CUP LEK RST (MISCELLANEOUS) IMPLANT
COVER SURGICAL LIGHT HANDLE (MISCELLANEOUS) ×2 IMPLANT
DERMABOND ADVANCED .7 DNX12 (GAUZE/BANDAGES/DRESSINGS) ×2 IMPLANT
DRAPE 3/4 80X56 (DRAPES) ×4 IMPLANT
DRAPE C-ARM 42X120 X-RAY (DRAPES) ×2 IMPLANT
DRAPE HIP W/POCKET STRL (MISCELLANEOUS) ×2 IMPLANT
DRAPE INCISE IOBAN 66X45 STRL (DRAPES) ×2 IMPLANT
DRAPE INCISE IOBAN 85X60 (DRAPES) ×2 IMPLANT
DRAPE POUCH INSTRU U-SHP 10X18 (DRAPES) ×2 IMPLANT
DRAPE U-SHAPE 47X51 STRL (DRAPES) ×2 IMPLANT
DRESSING PEEL AND PLAC PRVNA20 (GAUZE/BANDAGES/DRESSINGS) IMPLANT
DRSG AQUACEL AG ADV 3.5X10 (GAUZE/BANDAGES/DRESSINGS) ×2 IMPLANT
DRSG PEEL AND PLACE PREVENA 20 (GAUZE/BANDAGES/DRESSINGS) ×2
ELECT BLADE TIP CTD 4 INCH (ELECTRODE) ×2 IMPLANT
ELECT NDL TIP 2.8 STRL (NEEDLE) ×2 IMPLANT
ELECT NEEDLE TIP 2.8 STRL (NEEDLE) ×2
ELECT REM PT RETURN 15FT ADLT (MISCELLANEOUS) ×2 IMPLANT
GLOVE BIO SURGEON STRL SZ 6.5 (GLOVE) ×4 IMPLANT
GLOVE BIOGEL PI IND STRL 6.5 (GLOVE) ×2 IMPLANT
GLOVE BIOGEL PI IND STRL 8 (GLOVE) ×2 IMPLANT
GLOVE SURG ORTHO 8.0 STRL STRW (GLOVE) ×4 IMPLANT
GOWN STRL REUS W/ TWL XL LVL3 (GOWN DISPOSABLE) ×2 IMPLANT
HIP BALL CERAMIC 32MM PLUS 1 ×2 IMPLANT
HOLDER FOLEY CATH W/STRAP (MISCELLANEOUS) ×2 IMPLANT
HOOD PEEL AWAY T7 (MISCELLANEOUS) ×6 IMPLANT
JET LAVAGE IRRISEPT WOUND (IRRIGATION / IRRIGATOR)
KIT BASIN OR (CUSTOM PROCEDURE TRAY) ×2 IMPLANT
KIT DRSG PREVENA PLUS 7DAY 125 (MISCELLANEOUS) IMPLANT
KIT TURNOVER KIT A (KITS) IMPLANT
LAVAGE JET IRRISEPT WOUND (IRRIGATION / IRRIGATOR) IMPLANT
LINER ACETABULAR 32X50 (Liner) IMPLANT
MANIFOLD NEPTUNE II (INSTRUMENTS) ×2 IMPLANT
MARKER SKIN DUAL TIP RULER LAB (MISCELLANEOUS) ×2 IMPLANT
NDL HYPO 22X1.5 SAFETY MO (MISCELLANEOUS) IMPLANT
NEEDLE HYPO 22X1.5 SAFETY MO (MISCELLANEOUS)
NS IRRIG 1000ML POUR BTL (IV SOLUTION) ×2 IMPLANT
PACK TOTAL JOINT (CUSTOM PROCEDURE TRAY) ×2 IMPLANT
PROTECTOR NERVE ULNAR (MISCELLANEOUS) ×2 IMPLANT
RETRIEVER SUT HEWSON (MISCELLANEOUS) ×2 IMPLANT
SEALER BIPOLAR AQUA 6.0 (INSTRUMENTS) ×2 IMPLANT
SET HNDPC FAN SPRY TIP SCT (DISPOSABLE) IMPLANT
SOLUTION IRRIG SURGIPHOR (IV SOLUTION) ×2 IMPLANT
SPIKE FLUID TRANSFER (MISCELLANEOUS) ×6 IMPLANT
SUCTION TUBE FRAZIER 12FR DISP (SUCTIONS) ×2 IMPLANT
SUT BONE WAX W31G (SUTURE) ×2 IMPLANT
SUT ETHIBOND #5 BRAIDED 30INL (SUTURE) ×2 IMPLANT
SUT MNCRL AB 3-0 PS2 18 (SUTURE) ×2 IMPLANT
SUT NYLON 3 0 (SUTURE) IMPLANT
SUT STRATAFIX 0 PDS 27 VIOLET (SUTURE) ×2
SUT STRATAFIX 14 PDO 48 VLT (SUTURE) ×2 IMPLANT
SUT STRATAFIX PDO 1 14 VIOLET (SUTURE) ×2
SUT VIC AB 2-0 CT2 27 (SUTURE) ×4 IMPLANT
SUTURE STRATFX 0 PDS 27 VIOLET (SUTURE) ×2 IMPLANT
SYR 20ML LL LF (SYRINGE) ×4 IMPLANT
TOWEL OR 17X26 10 PK STRL BLUE (TOWEL DISPOSABLE) ×2 IMPLANT
TRAY FOLEY MTR SLVR 16FR STAT (SET/KITS/TRAYS/PACK) ×2 IMPLANT
TUBE SUCTION HIGH CAP CLEAR NV (SUCTIONS) ×2 IMPLANT
UNDERPAD 30X36 HEAVY ABSORB (UNDERPADS AND DIAPERS) ×2 IMPLANT
WATER STERILE IRR 1000ML POUR (IV SOLUTION) ×4 IMPLANT

## 2023-11-18 NOTE — Anesthesia Postprocedure Evaluation (Signed)
 Anesthesia Post Note  Patient: Anne Edwards  Procedure(s) Performed: TOTAL HIP REVISION (Left: Hip) APPLICATION OF WOUND VAC (Left)     Patient location during evaluation: PACU Anesthesia Type: General Level of consciousness: awake and alert Pain management: pain level controlled Vital Signs Assessment: post-procedure vital signs reviewed and stable Respiratory status: spontaneous breathing, nonlabored ventilation and respiratory function stable Cardiovascular status: blood pressure returned to baseline Postop Assessment: no apparent nausea or vomiting Anesthetic complications: no   No notable events documented.            Vertell Row

## 2023-11-18 NOTE — Discharge Instructions (Signed)
 INSTRUCTIONS AFTER JOINT REPLACEMENT   Remove items at home which could result in a fall. This includes throw rugs or furniture in walking pathways ICE to the affected joint every three hours while awake for 30 minutes at a time, for at least the first 3-5 days, and then as needed for pain and swelling.  Continue to use ice for pain and swelling. You may notice swelling that will progress down to the foot and ankle.  This is normal after surgery.  Elevate your leg when you are not up walking on it.   Continue to use the breathing machine you got in the hospital (incentive spirometer) which will help keep your temperature down.  It is common for your temperature to cycle up and down following surgery, especially at night when you are not up moving around and exerting yourself.  The breathing machine keeps your lungs expanded and your temperature down.  DIET:  As you were doing prior to hospitalization, we recommend a well-balanced diet.  DRESSING / WOUND CARE / SHOWERING:  Keep the surgical dressing until follow up.  The dressing is water proof, so you can shower without any extra covering.  IF THE DRESSING FALLS OFF or the wound gets wet inside, change the dressing with sterile gauze.  Please use good hand washing techniques before changing the dressing.  Do not use any lotions or creams on the incision until instructed by your surgeon.    ACTIVITY  Increase activity slowly as tolerated, but follow the weight bearing instructions below.   No driving for 6 weeks or until further direction given by your physician.  You cannot drive while taking narcotics.  No lifting or carrying greater than 10 lbs. until further directed by your surgeon. Avoid periods of inactivity such as sitting longer than an hour when not asleep. This helps prevent blood clots.  You may return to work once you are authorized by your doctor.   WEIGHT BEARING: Weight bearing as tolerated with assist device (walker, cane, etc) as  directed, use it as long as suggested by your surgeon or therapist, typically at least 4-6 weeks.  EXERCISES  Results after joint replacement surgery are often greatly improved when you follow the exercise, range of motion and muscle strengthening exercises prescribed by your doctor. Safety measures are also important to protect the joint from further injury. Any time any of these exercises cause you to have increased pain or swelling, decrease what you are doing until you are comfortable again and then slowly increase them. If you have problems or questions, call your caregiver or physical therapist for advice.   Rehabilitation is important following a joint replacement. After just a few days of immobilization, the muscles of the leg can become weakened and shrink (atrophy).  These exercises are designed to build up the tone and strength of the thigh and leg muscles and to improve motion. Often times heat used for twenty to thirty minutes before working out will loosen up your tissues and help with improving the range of motion but do not use heat for the first two weeks following surgery (sometimes heat can increase post-operative swelling).   These exercises can be done on a training (exercise) mat, on the floor, on a table or on a bed. Use whatever works the best and is most comfortable for you.    Use music or television while you are exercising so that the exercises are a pleasant break in your day. This will make your life  better with the exercises acting as a break in your routine that you can look forward to.   Perform all exercises about fifteen times, three times per day or as directed.  You should exercise both the operative leg and the other leg as well.  Exercises include:   Quad Sets - Tighten up the muscle on the front of the thigh (Quad) and hold for 5-10 seconds.   Straight Leg Raises - With your knee straight (if you were given a brace, keep it on), lift the leg to 60 degrees, hold  for 3 seconds, and slowly lower the leg.  Perform this exercise against resistance later as your leg gets stronger.  Leg Slides: Lying on your back, slowly slide your foot toward your buttocks, bending your knee up off the floor (only go as far as is comfortable). Then slowly slide your foot back down until your leg is flat on the floor again.  Angel Wings: Lying on your back spread your legs to the side as far apart as you can without causing discomfort.  Hamstring Strength:  Lying on your back, push your heel against the floor with your leg straight by tightening up the muscles of your buttocks.  Repeat, but this time bend your knee to a comfortable angle, and push your heel against the floor.  You may put a pillow under the heel to make it more comfortable if necessary.   A rehabilitation program following joint replacement surgery can speed recovery and prevent re-injury in the future due to weakened muscles. Contact your doctor or a physical therapist for more information on knee rehabilitation.   CONSTIPATION:  Constipation is defined medically as fewer than three stools per week and severe constipation as less than one stool per week.  Even if you have a regular bowel pattern at home, your normal regimen is likely to be disrupted due to multiple reasons following surgery.  Combination of anesthesia, postoperative narcotics, change in appetite and fluid intake all can affect your bowels.   YOU MUST use at least one of the following options; they are listed in order of increasing strength to get the job done.  They are all available over the counter, and you may need to use some, POSSIBLY even all of these options:    Drink plenty of fluids (prune juice may be helpful) and high fiber foods Colace 100 mg by mouth twice a day  Senokot for constipation as directed and as needed Dulcolax (bisacodyl), take with full glass of water  Miralax (polyethylene glycol) once or twice a day as needed.  If you  have tried all these things and are unable to have a bowel movement in the first 3-4 days after surgery call either your surgeon or your primary doctor.    If you experience loose stools or diarrhea, hold the medications until you stool forms back up.  If your symptoms do not get better within 1 week or if they get worse, check with your doctor.  If you experience "the worst abdominal pain ever" or develop nausea or vomiting, please contact the office immediately for further recommendations for treatment.  ITCHING:  If you experience itching with your medications, try taking only a single pain pill, or even half a pain pill at a time.  You can also use Benadryl over the counter for itching or also to help with sleep.   TED HOSE STOCKINGS:  Use stockings on both legs until for at least 2 weeks or  as directed by physician office. They may be removed at night for sleeping.  MEDICATIONS:  See your medication summary on the "After Visit Summary" that nursing will review with you.  You may have some home medications which will be placed on hold until you complete the course of blood thinner medication.  It is important for you to complete the blood thinner medication as prescribed.  Blood clot prevention (DVT Prophylaxis): After surgery you are at an increased risk for a blood clot. you were prescribed a blood thinner, Aspirin 81mg , to be taken twice daily for a total of 4 weeks from surgery to help reduce your risk of getting a blood clot.  Signs of a pulmonary embolus (blood clot in the lungs) include sudden short of breath, feeling lightheaded or dizzy, chest pain with a deep breath, rapid pulse rapid breathing.  Signs of a blood clot in your arms or legs include new unexplained swelling and cramping, warm, red or darkened skin around the painful area.  Please call the office or 911 right away if these signs or symptoms develop.  PRECAUTIONS:   If you experience chest pain or shortness of breath - call 911  immediately for transfer to the hospital emergency department.   If you develop a fever greater that 101 F, purulent drainage from wound, increased redness or drainage from wound, foul odor from the wound/dressing, or calf pain - CONTACT YOUR SURGEON.                                                   FOLLOW-UP APPOINTMENTS:  If you do not already have a post-op appointment, please call the office for an appointment to be seen by your surgeon.  Guidelines for how soon to be seen are listed in your "After Visit Summary", but are typically between 2-3 weeks after surgery.  If you have a specialized bandage, you may be told to follow up 1 week after surgery.  POST-OPERATIVE OPIOID TAPER INSTRUCTIONS: It is important to wean off of your opioid medication as soon as possible. If you do not need pain medication after your surgery it is ok to stop day one. Opioids include: Codeine, Hydrocodone(Norco, Vicodin), Oxycodone(Percocet, oxycontin) and hydromorphone amongst others.  Long term and even short term use of opiods can cause: Increased pain response Dependence Constipation Depression Respiratory depression And more.  Withdrawal symptoms can include Flu like symptoms Nausea, vomiting And more Techniques to manage these symptoms Hydrate well Eat regular healthy meals Stay active Use relaxation techniques(deep breathing, meditating, yoga) Do Not substitute Alcohol to help with tapering If you have been on opioids for less than two weeks and do not have pain than it is ok to stop all together.  Plan to wean off of opioids This plan should start within one week post op of your joint replacement. Maintain the same interval or time between taking each dose and first decrease the dose.  Cut the total daily intake of opioids by one tablet each day Next start to increase the time between doses. The last dose that should be eliminated is the evening dose.   MAKE SURE YOU:  Understand these  instructions.  Get help right away if you are not doing well or get worse.    Thank you for letting us be a part of your medical care team.  It is a privilege we respect greatly.  We hope these instructions will help you stay on track for a fast and full recovery!

## 2023-11-18 NOTE — Anesthesia Preprocedure Evaluation (Addendum)
 Anesthesia Evaluation  Patient identified by MRN, date of birth, ID band Patient awake    Reviewed: Allergy & Precautions, NPO status , Patient's Chart, lab work & pertinent test results  History of Anesthesia Complications Negative for: history of anesthetic complications  Airway Mallampati: II  TM Distance: >3 FB Neck ROM: Full    Dental  (+) Missing,    Pulmonary neg pulmonary ROS   Pulmonary exam normal        Cardiovascular negative cardio ROS Normal cardiovascular exam     Neuro/Psych   Anxiety     negative neurological ROS     GI/Hepatic Neg liver ROS, hiatal hernia,GERD  Medicated and Poorly Controlled,,  Endo/Other  negative endocrine ROS    Renal/GU negative Renal ROS  negative genitourinary   Musculoskeletal  (+) Arthritis ,    Abdominal   Peds  Hematology negative hematology ROS (+)   Anesthesia Other Findings Day of surgery medications reviewed with patient.  Reproductive/Obstetrics negative OB ROS                             Anesthesia Physical Anesthesia Plan  ASA: 2  Anesthesia Plan: General   Post-op Pain Management: Tylenol  PO (pre-op)*   Induction: Intravenous  PONV Risk Score and Plan: 3 and Ondansetron , Dexamethasone , Treatment may vary due to age or medical condition and Midazolam   Airway Management Planned: Oral ETT  Additional Equipment: None  Intra-op Plan:   Post-operative Plan: Extubation in OR  Informed Consent: I have reviewed the patients History and Physical, chart, labs and discussed the procedure including the risks, benefits and alternatives for the proposed anesthesia with the patient or authorized representative who has indicated his/her understanding and acceptance.     Dental advisory given  Plan Discussed with: CRNA  Anesthesia Plan Comments:        Anesthesia Quick Evaluation

## 2023-11-18 NOTE — Op Note (Signed)
 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 female who had undergone total hip arthroplasty with Dr. Shari about 3 years ago.  She had done well after surgery.  Unfortunately over the past month she started to notice some squeaking in her left hip as well as having some pain when she followed up with Dr. Shari was found to have articulation of the hip ball on the liner concerning for wear or damage to the polyethylene insert.  This was confirmed with CT scan.  Preoperative aspiration was reassuring against infection.  Given the potential continued damage to the titanium acetabular shell elected for revision of the total hip arthroplasty with plan for headliner exchange.  The risks benefits and alternatives were discussed with the patient including but not limited to the risks of nonoperative treatment, versus surgical intervention including infection, bleeding, nerve injury, periprosthetic fracture, the need for revision surgery, dislocation, leg length discrepancy, blood clots, cardiopulmonary complications, morbidity, mortality, among others, and they were willing to proceed.     OPERATIVE REPORT     SURGEON:  Toribio Higashi, MD    ASSISTANT: Bernarda Mclean, PA-C, (Present throughout the entire procedure,  necessary for completion of procedure in a timely manner, assisting with retraction, instrumentation, and closure)     ANESTHESIA: General  ESTIMATED BLOOD LOSS: 200cc    COMPLICATIONS:  None.   COMPONENTS:   DePuy Pinnacle 50 mm x 32 mm neutral acetabular liner, 32+1 ceramic head with titanium sleeve Implant Name Type Inv. Item Serial No. Manufacturer Lot No. LRB No. Used  Action  HIP BALL CERAMIC PLUS 1 - [id number]  HIP BALL CERAMIC PLUS 1  [employer] [id number] Left 1 Implanted  LINER ACETABULAR 32X50 - [id number] Liner LINER ACETABULAR 32X50  [employer] [id number] Left 1 Implanted    The aquamantis was utilized for this case to help facilitate better hemostasis as patient was felt to be at increased risk of bleeding because of complex case requiring increased OR time and/or exposure.        PROCEDURE IN DETAIL:   The patient was met in the holding area and  identified.  The appropriate hip was identified and marked at the operative site.  The patient was then transported to the OR  and  placed under anesthesia.  At that point, the patient was  placed in the lateral decubitus position with the operative side up and  secured to the operating room table  and all bony prominences padded. A subaxillary role was also placed.    The operative lower extremity was prepped from the iliac crest to the distal leg.  Sterile draping was performed.  Preoperative antibiotics, 2 gm of ancef ,1 gm of Tranexamic Acid , and 8 mg of Decadron  administered. Time out was performed prior to incision.      A routine posterolateral approach was utilized via sharp dissection  carried down to the subcutaneous tissue utilizing the patient's old scar.  Gross bleeders were Bovie coagulated.  The iliotibial band was identified and incised along the length of the skin incision through the glute max fascia.  Charnley retractor was placed with care to protect the sciatic nerve posteriorly a capsulotomy was then performed off  the femoral insertion and also tagged with a #5 Ethibond.  There is small amount of serous fluid in the hip joint no purulence there was some titanium corrosion present to the synovial surrounding tissues.  3 soft tissue specimens were sent for routine aerobic and anaerobic culture.  The hip was then dislocated and the ceramic ball was removed with a bone tamp.   The trunnion was inspected found to have no damage or wear.  I then exposed the deep acetabulum. After adequate visualization, the liner was found to be dislodged and spun around.  There was damage to the lip portion of the liner which appeared to be broken off.  The acetabular shell was inspected and found to be well-fixed, well-positioned without any wear damage to the locking mechanism.   A neutral liner was then trialed.  On the trunnion we used a 32+1 ceramic head ball, and I reduced the hip and it was found to have excellent stability.  There was no impingement with full extension and 90 degrees external rotation.  The hip was stable at the position of sleep and with 90 degrees flexion and 70 degrees of internal rotation.  Leg lengths were also clinically assessed in the lateral position and felt to be equal.   The trial head ball and liner were then removed.  Make sure to have adequate visualization of the acetabular component full 360 degrees.  The new neutral liner was then impacted into a clean dry acetabular shell, 32+1 ceramic head ball with titanium sleeve were opened and impacted onto a clean dry trunnion.  The posterior capsule was then closed with #5 Ethibond.    I then irrigated the hip copiously with Irrisept and with normal saline pulse lavage. Periarticular injection was then performed with Exparel .   We repaired the fascia #1 barbed suture, followed by 0 barbed suture for the subcutaneous fat.  Skin was closed with 2-0 Vicryl and 3-0 nylon.  Incisional Prevena wound VAC was applied.  The patient was then awakened and returned to PACU in stable and satisfactory condition.  Leg lengths in the supine position were assessed and felt to be clinically equal. There were no complications.  Post op recs: WB: WBAT LLE, posterior precautions x 6 weeks Abx: ancef , DC on cefadroxil  500 twice daily x 7 days Imaging: PACU pelvis Xray Dressing: Aquacell, keep intact until follow up DVT  prophylaxis: Aspirin  81BID starting POD1 Follow up: 2 weeks after surgery for a wound check with Dr. Edna at [facility].  Address: [address], [place], [place] [address]  Office Phone: [phone]   Toribio Edna, MD Orthopedic Surgeon

## 2023-11-18 NOTE — Transfer of Care (Signed)
 Immediate Anesthesia Transfer of Care Note  Patient: Anne Edwards  Procedure(s) Performed: TOTAL HIP REVISION (Left: Hip) APPLICATION OF WOUND VAC (Left)  Patient Location: PACU  Anesthesia Type:General  Level of Consciousness: awake, alert , oriented, and patient cooperative  Airway & Oxygen Therapy: Patient Spontanous Breathing and Patient connected to face mask oxygen  Post-op Assessment: Report given to RN and Post -op Vital signs reviewed and stable  Post vital signs: Reviewed and stable  Last Vitals:  Vitals Value Taken Time  BP 140/63 11/18/23 1407  Temp 36.5 C 11/18/23 1407  Pulse 78 11/18/23 1410  Resp 12 11/18/23 1410  SpO2 99 % 11/18/23 1410  Vitals shown include unfiled device data.  Last Pain:  Vitals:   11/18/23 0916  TempSrc: Oral         Complications: No notable events documented.

## 2023-11-18 NOTE — Interval H&P Note (Signed)
 The patient has been re-examined, and the chart reviewed, and there have been no interval changes to the documented history and physical.    Plan for Left hip revision for mechanical failure of the polyethylene liner  The operative side was examined and the patient was confirmed to have sensation to DPN, SPN, TN intact, Motor EHL, ext, flex 5/5, and DP 2+, PT 2+, No significant edema.   The risks, benefits, and alternatives have been discussed at length with patient, and the patient is willing to proceed.  Left hip marked. Consent has been signed.

## 2023-11-19 ENCOUNTER — Encounter (HOSPITAL_COMMUNITY): Payer: Self-pay | Admitting: Orthopedic Surgery

## 2023-11-19 LAB — CBC
HCT: 38 % (ref 36.0–46.0)
Hemoglobin: 12.3 g/dL (ref 12.0–15.0)
MCH: 27.5 pg (ref 26.0–34.0)
MCHC: 32.4 g/dL (ref 30.0–36.0)
MCV: 84.8 fL (ref 80.0–100.0)
Platelets: 214 10*3/uL (ref 150–400)
RBC: 4.48 MIL/uL (ref 3.87–5.11)
RDW: 13.2 % (ref 11.5–15.5)
WBC: 10.4 10*3/uL (ref 4.0–10.5)
nRBC: 0 % (ref 0.0–0.2)

## 2023-11-19 LAB — BASIC METABOLIC PANEL
Anion gap: 10 (ref 5–15)
BUN: 15 mg/dL (ref 8–23)
CO2: 25 mmol/L (ref 22–32)
Calcium: 8.8 mg/dL — ABNORMAL LOW (ref 8.9–10.3)
Chloride: 103 mmol/L (ref 98–111)
Creatinine, Ser: 0.46 mg/dL (ref 0.44–1.00)
GFR, Estimated: >60 mL/min (ref >60)
Glucose, Bld: 134 mg/dL — ABNORMAL HIGH (ref 70–99)
Potassium: 4.2 mmol/L (ref 3.5–5.1)
Sodium: 138 mmol/L (ref 135–145)

## 2023-11-19 MED ORDER — CEFADROXIL 500 MG PO CAPS: 500.0000 mg | ORAL_CAPSULE | Freq: Two times a day (BID) | ORAL | Status: DC

## 2023-11-19 MED ORDER — CEFADROXIL 500 MG PO CAPS: 500.0000 mg | ORAL_CAPSULE | Freq: Two times a day (BID) | ORAL | 0 refills | Status: DC

## 2023-11-19 NOTE — Evaluation (Signed)
 Physical Therapy Evaluation Patient Details Name: Anne Edwards MRN: [id number] DOB: 06/03/1961 Today's Date: 11/19/2023  History of Present Illness  Pt s/p L THR poly exchange and with hx of L THR in 2021  Clinical Impression  Pt admitted as above and presenting with functional mobility limitations 2* decreased L LE strength/ROM, post op pain and posterior THP.  Pt should progress to dc home with family assist and reports will have follow up HHPT.        If plan is discharge home, recommend the following: A little help with walking and/or transfers;A little help with bathing/dressing/bathroom;Assistance with cooking/housework;Assist for transportation;Help with stairs or ramp for entrance   Can travel by private vehicle        Equipment Recommendations None recommended by PT  Recommendations for Other Services       Functional Status Assessment Patient has had a recent decline in their functional status and demonstrates the ability to make significant improvements in function in a reasonable and predictable amount of time.     Precautions / Restrictions Precautions Precautions: Fall;Posterior Hip Precaution Booklet Issued: Yes (comment) Restrictions Weight Bearing Restrictions Per Provider Order: Yes LLE Weight Bearing Per Provider Order: Weight bearing as tolerated      Mobility  Bed Mobility Overal bed mobility: Needs Assistance Bed Mobility: Supine to Sit     Supine to sit: Min assist     General bed mobility comments: Cues for sequence and use of R LE to self assist    Transfers Overall transfer level: Needs assistance Equipment used: Rolling walker (2 wheels) Transfers: Sit to/from Stand Sit to Stand: Min assist           General transfer comment: cues for LE management and use of UEs to self assist    Ambulation/Gait Ambulation/Gait assistance: Min assist Gait Distance (Feet): 80 Feet Assistive device: Rolling walker (2 wheels) Gait  Pattern/deviations: Step-to pattern, Step-through pattern, Decreased step length - right, Decreased step length - left, Shuffle, Trunk flexed Gait velocity: decr     General Gait Details: cues for sequence, posture and position from [employer]            Wheelchair Mobility     Tilt Bed    Modified Rankin (Stroke Patients Only)       Balance Overall balance assessment: Needs assistance Sitting-balance support: No upper extremity supported, Feet supported Sitting balance-Leahy Scale: Good     Standing balance support: Bilateral upper extremity supported Standing balance-Leahy Scale: Poor                               Pertinent Vitals/Pain Pain Assessment Pain Assessment: 0-10 Pain Score: 6  Pain Location: L hip Pain Descriptors / Indicators: Aching, Sore Pain Intervention(s): Limited activity within patient's tolerance, Monitored during session, Premedicated before session, Ice applied    Home Living Family/patient expects to be discharged to:: Private residence Living Arrangements: Spouse/significant other Available Help at Discharge: Family;Available 24 hours/day Type of Home: House Home Access: Stairs to enter Entrance Stairs-Rails: Right;Left;Can reach both Entrance Stairs-Number of Steps: 1+2 Alternate Level Stairs-Number of Steps: 1 Home Layout: Two level Home Equipment: [employer] (2 wheels);Cane - single point      Prior Function Prior Level of Function : Independent/Modified Independent                     Extremity/Trunk Assessment   Upper Extremity Assessment Upper  Extremity Assessment: Overall WFL for tasks assessed    Lower Extremity Assessment Lower Extremity Assessment: LLE deficits/detail LLE Deficits / Details: AAROM at hip to 80 flex and 20 abd; hip strength 2/5    Cervical / Trunk Assessment Cervical / Trunk Assessment: Normal  Communication   Communication Communication: No apparent difficulties   Cognition Arousal: Alert Behavior During Therapy: WFL for tasks assessed/performed Overall Cognitive Status: Within Functional Limits for tasks assessed                                          General Comments      Exercises Total Joint Exercises Ankle Circles/Pumps: AROM, Both, 15 reps, Supine Quad Sets: AROM, Both, 10 reps, Supine Heel Slides: AAROM, Left, 20 reps, Supine Hip ABduction/ADduction: AAROM, Left, 15 reps, Supine   Assessment/Plan    PT Assessment Patient needs continued PT services  PT Problem List Decreased strength;Decreased range of motion;Decreased activity tolerance;Decreased balance;Decreased mobility;Decreased knowledge of use of DME;Pain       PT Treatment Interventions DME instruction;Gait training;Stair training;Functional mobility training;Therapeutic activities;Therapeutic exercise;Patient/family education    PT Goals (Current goals can be found in the Care Plan section)  Acute Rehab PT Goals Patient Stated Goal: Regain IND PT Goal Formulation: With patient Time For Goal Achievement: 11/26/23 Potential to Achieve Goals: Good    Frequency 7X/week     Co-evaluation               AM-PAC PT 6 Clicks Mobility  Outcome Measure Help needed turning from your back to your side while in a flat bed without using bedrails?: A Little Help needed moving from lying on your back to sitting on the side of a flat bed without using bedrails?: A Little Help needed moving to and from a bed to a chair (including a wheelchair)?: A Little Help needed standing up from a chair using your arms (e.g., wheelchair or bedside chair)?: A Little Help needed to walk in hospital room?: A Little Help needed climbing 3-5 steps with a railing? : A Little 6 Click Score: 18    End of Session Equipment Utilized During Treatment: Gait belt Activity Tolerance: Patient tolerated treatment well Patient left: in chair;with call bell/phone within reach;with  chair alarm set;with family/visitor present Nurse Communication: Mobility status PT Visit Diagnosis: Difficulty in walking, not elsewhere classified (R26.2)    Time: 9082-9050 PT Time Calculation (min) (ACUTE ONLY): 32 min   Charges:   PT Evaluation $PT Eval Low Complexity: 1 Low PT Treatments $Therapeutic Exercise: 8-22 mins PT General Charges $$ ACUTE PT VISIT: 1 Visit         Anne Edwards PT Acute Rehabilitation Services Pager [phone] Office [phone]   Anne Edwards 11/19/2023, 1:21 PM

## 2023-11-19 NOTE — Progress Notes (Addendum)
     Subjective:  Patient reports pain as mild.  Did sit up at side of the bed but hasn't yet worked with PT. Denies distal n/t.  Objective:   VITALS:   Vitals:   11/18/23 1600 11/18/23 1714 11/18/23 1944 11/19/23 0231  BP: (!) 122/56 (!) 128/57 125/79 116/80  Pulse: 84 86 66 70  Resp: 13 17 14 15   Temp:  98.2 F (36.8 C) 98 F (36.7 C) (!) 97.5 F (36.4 C)  TempSrc:  Oral Oral Oral  SpO2: 97% 100% 100% 97%  Weight:      Height:        Sensation intact distally Intact pulses distally Dorsiflexion/Plantar flexion intact Incision: dressing C/D/I    Lab Results  Component Value Date   WBC 10.4 11/19/2023   HGB 12.3 11/19/2023   HCT 38.0 11/19/2023   MCV 84.8 11/19/2023   PLT 214 11/19/2023   BMET    Component Value Date/Time   NA 138 11/19/2023 0347   K 4.2 11/19/2023 0347   CL 103 11/19/2023 0347   CO2 25 11/19/2023 0347   GLUCOSE 134 (H) 11/19/2023 0347   BUN 15 11/19/2023 0347   CREATININE 0.46 11/19/2023 0347   CALCIUM 8.8 (L) 11/19/2023 0347   GFRNONAA >60 11/19/2023 0347    Xray: THA components in good position no adverse features  Assessment/Plan: 1 Day Post-Op   Principal Problem:   Failed total hip arthroplasty (HCC)  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 Edna at [facility].  Address: [address], [place], [place] [address]  Office Phone: [phone]    TORIBIO DELENA EDNA 11/19/2023, 6:37 AM   Toribio Edna, MD  Contact information:   [phone] 7am-5pm epic message Dr. Edna, or call office for patient follow up: [phone] After hours and holidays please check [email or web address] for group call  information for [facility]

## 2023-11-19 NOTE — Discharge Summary (Signed)
 Physician Discharge Summary  Patient ID: Anne Edwards MRN: [id number] DOB/AGE: 1961-03-07 63 y.o.  Admit date: 11/18/2023 Discharge date: 11/19/2023  Admission Diagnoses:  Failed total hip arthroplasty [facility])  Discharge Diagnoses:  Principal Problem:   Failed total hip arthroplasty [facility])   Past Medical History:  Diagnosis Date   Anxiety    Arthritis    Complication of anesthesia    Slow to wake up   History of hiatal hernia    IDA (iron deficiency anemia) 11/24/2018   Nausea and vomiting 11/26/2020   Pneumonia     Surgeries: Procedure(s): TOTAL HIP REVISION APPLICATION OF WOUND VAC on 11/18/2023   Consultants (if any):   Discharged Condition: Improved  Hospital Course: Anne Edwards is an 63 y.o. female who was admitted 11/18/2023 with a diagnosis of Failed total hip arthroplasty (HCC) and went to the operating room on 11/18/2023 and underwent the above named procedures.    She was given perioperative antibiotics:  Anti-infectives (From admission, onward)    Start     Dose/Rate Route Frequency Ordered Stop   11/19/23 0000  cefadroxil  (DURICEF) 500 MG capsule        500 mg Oral 2 times daily 11/19/23 0645 11/26/23 2359   11/18/23 2200  ceFAZolin  (ANCEF ) IVPB 2g/100 mL premix        2 g 200 mL/hr over 30 Minutes Intravenous Every 8 hours 11/18/23 1711 11/21/23 2159   11/18/23 0845  ceFAZolin  (ANCEF ) IVPB 2g/100 mL premix        2 g 200 mL/hr over 30 Minutes Intravenous On call to O.R. 11/18/23 0843 11/18/23 1217   11/18/23 0000  cefadroxil  (DURICEF) 500 MG capsule        500 mg Oral 2 times daily 11/18/23 1358 11/25/23 2359     .  She was given sequential compression devices, early ambulation, and aspirin  for DVT prophylaxis.  She benefited maximally from the hospital stay and there were no complications.    Recent vital signs:  Vitals:   11/18/23 1944 11/19/23 0231  BP: 125/79 116/80  Pulse: 66 70  Resp: 14 15  Temp: 98 F (36.7 C) (!) 97.5 F (36.4 C)  SpO2:  100% 97%    Recent laboratory studies:  Lab Results  Component Value Date   HGB 12.3 11/19/2023   HGB 13.6 11/10/2023   HGB 12.9 02/11/2021   Lab Results  Component Value Date   WBC 10.4 11/19/2023   PLT 214 11/19/2023   No results found for: INR Lab Results  Component Value Date   NA 138 11/19/2023   K 4.2 11/19/2023   CL 103 11/19/2023   CO2 25 11/19/2023   BUN 15 11/19/2023   CREATININE 0.46 11/19/2023   GLUCOSE 134 (H) 11/19/2023    Discharge Medications:   Allergies as of 11/19/2023   No Known Allergies      Medication List     STOP taking these medications    acetaminophen  650 MG CR tablet Commonly known as: TYLENOL  Replaced by: acetaminophen  500 MG tablet       TAKE these medications    acetaminophen  500 MG tablet Commonly known as: TYLENOL  Take 2 tablets (1,000 mg total) by mouth every 8 (eight) hours as needed. Replaces: acetaminophen  650 MG CR tablet   aspirin  EC 81 MG tablet Take 1 tablet (81 mg total) by mouth 2 (two) times daily for 28 days. Swallow whole.   cefadroxil  500 MG capsule Commonly known as: DURICEF Take 1 capsule (  500 mg total) by mouth 2 (two) times daily for 7 days.   cefadroxil  500 MG capsule Commonly known as: DURICEF Take 1 capsule (500 mg total) by mouth 2 (two) times daily for 7 days.   celecoxib  100 MG capsule Commonly known as: CeleBREX  Take 1 capsule (100 mg total) by mouth 2 (two) times daily for 14 days.   clonazePAM  1 MG tablet Commonly known as: KLONOPIN  Take 1 mg by mouth at bedtime.   cyclobenzaprine  10 MG tablet Commonly known as: FLEXERIL  Take 1 tablet (10 mg total) by mouth 3 (three) times daily as needed for muscle spasms.   dicyclomine  10 MG capsule Commonly known as: BENTYL  Take 1 capsule (10 mg total) by mouth every 12 (twelve) hours as needed for spasms (abdominal pain).   escitalopram  20 MG tablet Commonly known as: LEXAPRO  Take 20 mg by mouth daily.   ondansetron  4 MG tablet Commonly  known as: Zofran  Take 1 tablet (4 mg total) by mouth every 8 (eight) hours as needed for up to 14 days for nausea or vomiting.   oxyCODONE  5 MG immediate release tablet Commonly known as: Roxicodone  Take 1 tablet (5 mg total) by mouth every 4 (four) hours as needed for up to 7 days for severe pain (pain score 7-10) or moderate pain (pain score 4-6).   pantoprazole  40 MG tablet Commonly known as: PROTONIX  Take 1 tablet (40 mg total) by mouth 2 (two) times daily.   polyethylene glycol 17 g packet Commonly known as: MiraLax  Take 17 g by mouth daily.        Diagnostic Studies: DG HIP UNILAT W OR W/O PELVIS 2-3 VIEWS LEFT Result Date: 11/18/2023 CLINICAL DATA:  Postop left hip replacement EXAM: DG HIP (WITH OR WITHOUT PELVIS) 2-3V LEFT COMPARISON:  11/10/2023 FINDINGS: Good appearance following total hip arthroplasty on the left. No radiographically detectable complication. IMPRESSION: Good appearance following total hip arthroplasty on the left. Electronically Signed   By: Oneil Officer M.D.   On: 11/18/2023 18:20   CT HIP LEFT WO CONTRAST Result Date: 11/17/2023 CLINICAL DATA:  Left hip pain.  Pain started 10/14/2023 EXAM: CT OF THE LEFT HIP WITHOUT CONTRAST TECHNIQUE: Multidetector CT imaging of the left hip was performed according to the standard protocol. Multiplanar CT image reconstructions were also generated. RADIATION DOSE REDUCTION: This exam was performed according to the departmental dose-optimization program which includes automated exposure control, adjustment of the mA and/or kV according to patient size and/or use of iterative reconstruction technique. COMPARISON:  None Available. FINDINGS: Bones/Joint/Cartilage No acute fracture or dislocation. Left total hip arthroplasty with beam hardening artifact partially obscuring the adjacent soft tissue and osseous structures. No hardware failure or complication. No periarticular fluid collection or osteolysis. Normal alignment. 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 Ligaments Ligaments are suboptimally evaluated by CT. Muscles and Tendons Muscles are normal. No muscle atrophy. No intramuscular fluid collection or hematoma. Soft tissue No fluid collection or hematoma.  No soft tissue mass. IMPRESSION: 1. Left total hip arthroplasty without hardware failure or complication. 2. No acute osseous injury of the left hip. Electronically Signed   By: Julaine Blanch M.D.   On: 11/17/2023 13:37    Disposition: Discharge disposition: 01-Home or Self Care       Discharge Instructions     Call MD / Call 911   Complete by: As directed    If you experience chest pain or shortness of breath, CALL 911 and be transported to the hospital emergency  room.  If you develope a fever above 101 F, pus (white drainage) or increased drainage or redness at the wound, or calf pain, call your surgeon's office.   Constipation Prevention   Complete by: As directed    Drink plenty of fluids.  Prune juice may be helpful.  You may use a stool softener, such as Colace (over the counter) 100 mg twice a day.  Use MiraLax  (over the counter) for constipation as needed.   Diet - low sodium heart healthy   Complete by: As directed    Increase activity slowly as tolerated   Complete by: As directed    Post-operative opioid taper instructions:   Complete by: As directed    POST-OPERATIVE OPIOID TAPER INSTRUCTIONS: It is important to wean off of your opioid medication as soon as possible. If you do not need pain medication after your surgery it is ok to stop day one. Opioids include: Codeine, Hydrocodone(Norco, Vicodin), Oxycodone (Percocet, oxycontin ) and hydromorphone  amongst others.  Long term and even short term use of opiods can cause: Increased pain response Dependence Constipation Depression Respiratory depression And more.  Withdrawal symptoms can include Flu like symptoms Nausea, vomiting And more Techniques to manage these symptoms Hydrate  well Eat regular healthy meals Stay active Use relaxation techniques(deep breathing, meditating, yoga) Do Not substitute Alcohol  to help with tapering If you have been on opioids for less than two weeks and do not have pain than it is ok to stop all together.  Plan to wean off of opioids This plan should start within one week post op of your joint replacement. Maintain the same interval or time between taking each dose and first decrease the dose.  Cut the total daily intake of opioids by one tablet each day Next start to increase the time between doses. The last dose that should be eliminated is the evening dose.           Follow-up Information     Edna Toribio LABOR, MD Follow up in 2 week(s).   Specialty: Orthopedic Surgery Contact information: [address] [place] [address] [phone]                    Discharge Instructions      INSTRUCTIONS AFTER JOINT REPLACEMENT   Remove items at home which could result in a fall. This includes throw rugs or furniture in walking pathways ICE to the affected joint every three hours while awake for 30 minutes at a time, for at least the first 3-5 days, and then as needed for pain and swelling.  Continue to use ice for pain and swelling. You may notice swelling that will progress down to the foot and ankle.  This is normal after surgery.  Elevate your leg when you are not up walking on it.   Continue to use the breathing machine you got in the hospital (incentive spirometer) which will help keep your temperature down.  It is common for your temperature to cycle up and down following surgery, especially at night when you are not up moving around and exerting yourself.  The breathing machine keeps your lungs expanded and your temperature down.  DIET:  As you were doing prior to hospitalization, we recommend a well-balanced diet.  DRESSING / WOUND CARE / SHOWERING:  Keep the surgical dressing until follow up.   The dressing is water  proof, so you can shower without any extra covering.  IF THE DRESSING FALLS OFF or the  wound gets wet inside, change the dressing with sterile gauze.  Please use good hand washing techniques before changing the dressing.  Do not use any lotions or creams on the incision until instructed by your surgeon.    ACTIVITY  Increase activity slowly as tolerated, but follow the weight bearing instructions below.   No driving for 6 weeks or until further direction given by your physician.  You cannot drive while taking narcotics.  No lifting or carrying greater than 10 lbs. until further directed by your surgeon. Avoid periods of inactivity such as sitting longer than an hour when not asleep. This helps prevent blood clots.  You may return to work once you are authorized by your doctor.   WEIGHT BEARING: Weight bearing as tolerated with assist device (walker, cane, etc) as directed, use it as long as suggested by your surgeon or therapist, typically at least 4-6 weeks.  EXERCISES  Results after joint replacement surgery are often greatly improved when you follow the exercise, range of motion and muscle strengthening exercises prescribed by your doctor. Safety measures are also important to protect the joint from further injury. Any time any of these exercises cause you to have increased pain or swelling, decrease what you are doing until you are comfortable again and then slowly increase them. If you have problems or questions, call your caregiver or physical therapist for advice.   Rehabilitation is important following a joint replacement. After just a few days of immobilization, the muscles of the leg can become weakened and shrink (atrophy).  These exercises are designed to build up the tone and strength of the thigh and leg muscles and to improve motion. Often times heat used for twenty to thirty minutes before working out will loosen up your tissues and help with improving the range  of motion but do not use heat for the first two weeks following surgery (sometimes heat can increase post-operative swelling).   These exercises can be done on a training (exercise) mat, on the floor, on a table or on a bed. Use whatever works the best and is most comfortable for you.    Use music or television while you are exercising so that the exercises are a pleasant break in your day. This will make your life better with the exercises acting as a break in your routine that you can look forward to.   Perform all exercises about fifteen times, three times per day or as directed.  You should exercise both the operative leg and the other leg as well.  Exercises include:   Quad Sets - Tighten up the muscle on the front of the thigh (Quad) and hold for 5-10 seconds.   Straight Leg Raises - With your knee straight (if you were given a brace, keep it on), lift the leg to 60 degrees, hold for 3 seconds, and slowly lower the leg.  Perform this exercise against resistance later as your leg gets stronger.  Leg Slides: Lying on your back, slowly slide your foot toward your buttocks, bending your knee up off the floor (only go as far as is comfortable). Then slowly slide your foot back down until your leg is flat on the floor again.  Angel Wings: Lying on your back spread your legs to the side as far apart as you can without causing discomfort.  Hamstring Strength:  Lying on your back, push your heel against the floor with your leg straight by tightening up the muscles of your buttocks.  Repeat, but this time bend your knee to a comfortable angle, and push your heel against the floor.  You may put a pillow under the heel to make it more comfortable if necessary.   A rehabilitation program following joint replacement surgery can speed recovery and prevent re-injury in the future due to weakened muscles. Contact your doctor or a physical therapist for more information on knee rehabilitation.   CONSTIPATION:   Constipation is defined medically as fewer than three stools per week and severe constipation as less than one stool per week.  Even if you have a regular bowel pattern at home, your normal regimen is likely to be disrupted due to multiple reasons following surgery.  Combination of anesthesia, postoperative narcotics, change in appetite and fluid intake all can affect your bowels.   YOU MUST use at least one of the following options; they are listed in order of increasing strength to get the job done.  They are all available over the counter, and you may need to use some, POSSIBLY even all of these options:    Drink plenty of fluids (prune juice may be helpful) and high fiber foods Colace 100 mg by mouth twice a day  Senokot for constipation as directed and as needed Dulcolax (bisacodyl ), take with full glass of water   Miralax  (polyethylene glycol) once or twice a day as needed.  If you have tried all these things and are unable to have a bowel movement in the first 3-4 days after surgery call either your surgeon or your primary doctor.    If you experience loose stools or diarrhea, hold the medications until you stool forms back up.  If your symptoms do not get better within 1 week or if they get worse, check with your doctor.  If you experience the worst abdominal pain ever or develop nausea or vomiting, please contact the office immediately for further recommendations for treatment.  ITCHING:  If you experience itching with your medications, try taking only a single pain pill, or even half a pain pill at a time.  You can also use Benadryl  over the counter for itching or also to help with sleep.   TED HOSE STOCKINGS:  Use stockings on both legs until for at least 2 weeks or as directed by physician office. They may be removed at night for sleeping.  MEDICATIONS:  See your medication summary on the "After Visit Summary" that nursing will review with you.  You may have some home medications which  will be placed on hold until you complete the course of blood thinner medication.  It is important for you to complete the blood thinner medication as prescribed.  Blood clot prevention (DVT Prophylaxis): After surgery you are at an increased risk for a blood clot. you were prescribed a blood thinner, Aspirin  81mg , to be taken twice daily for a total of 4 weeks from surgery to help reduce your risk of getting a blood clot.  Signs of a pulmonary embolus (blood clot in the lungs) include sudden short of breath, feeling lightheaded or dizzy, chest pain with a deep breath, rapid pulse rapid breathing.  Signs of a blood clot in your arms or legs include new unexplained swelling and cramping, warm, red or darkened skin around the painful area.  Please call the office or 911 right away if these signs or symptoms develop.  PRECAUTIONS:   If you experience chest pain or shortness of breath - call 911 immediately for transfer to the hospital  emergency department.   If you develop a fever greater that 101 F, purulent drainage from wound, increased redness or drainage from wound, foul odor from the wound/dressing, or calf pain - CONTACT YOUR SURGEON.                                                   FOLLOW-UP APPOINTMENTS:  If you do not already have a post-op appointment, please call the office for an appointment to be seen by your surgeon.  Guidelines for how soon to be seen are listed in your "After Visit Summary", but are typically between 2-3 weeks after surgery.  If you have a specialized bandage, you may be told to follow up 1 week after surgery.  POST-OPERATIVE OPIOID TAPER INSTRUCTIONS: It is important to wean off of your opioid medication as soon as possible. If you do not need pain medication after your surgery it is ok to stop day one. Opioids include: Codeine, Hydrocodone(Norco, Vicodin), Oxycodone (Percocet, oxycontin ) and hydromorphone  amongst others.  Long term and even short term use of opiods can  cause: Increased pain response Dependence Constipation Depression Respiratory depression And more.  Withdrawal symptoms can include Flu like symptoms Nausea, vomiting And more Techniques to manage these symptoms Hydrate well Eat regular healthy meals Stay active Use relaxation techniques(deep breathing, meditating, yoga) Do Not substitute Alcohol  to help with tapering If you have been on opioids for less than two weeks and do not have pain than it is ok to stop all together.  Plan to wean off of opioids This plan should start within one week post op of your joint replacement. Maintain the same interval or time between taking each dose and first decrease the dose.  Cut the total daily intake of opioids by one tablet each day Next start to increase the time between doses. The last dose that should be eliminated is the evening dose.   MAKE SURE YOU:  Understand these instructions.  Get help right away if you are not doing well or get worse.    Thank you for letting us  be a part of your medical care team.  It is a privilege we respect greatly.  We hope these instructions will help you stay on track for a fast and full recovery!            Signed: Coleman Kalas A Iyesha Such 11/19/2023, 6:46 AM

## 2023-11-19 NOTE — TOC Initial Note (Signed)
 Transition of Care [facility]) - Initial/Assessment Note    Patient Details  Name: Anne Edwards MRN: [id number] Date of Birth: 16-Oct-1960  Transition of Care [facility]) CM/SW Contact:    Anne Manuella Quill, RN Phone Number: 11/19/2023, 12:29 PM  Clinical Narrative:                 [name] w/ pt and husband Anne Edwards [phone]) in room; pt says she lives at home and plans to return at d/c; her husband will provide transportation; pt verified insurance/PCP; she denies SDOH risks; pt has cane, walker, wheelchair, BSC, shower chair; she has HHPT that was previously arranged w/ [facility]; pt says she does not have home oxygen; Anne Edwards at [facility] notified of d/c; contact info for agency placed in follow up provider section of d/c instructions; no TOC needs.  Expected Discharge Plan: Home w Home Health Services Barriers to Discharge: No Barriers Identified   Patient Goals and CMS Choice Patient states their goals for this hospitalization and ongoing recovery are:: home CMS Medicare.gov Compare Post Acute Care list provided to:: Patient        Expected Discharge Plan and Services   Discharge Planning Services: CM Consult Post Acute Care Choice: Home Health Living arrangements for the past 2 months: Single Family Home Expected Discharge Date: 11/19/23               DME Arranged: N/A DME Agency: NA       HH Arranged: PT HH Agency: [facility] Date HH Agency Contacted: 11/19/23 Time HH Agency Contacted: 1227 Representative spoke with at [facility] Agency: Anne Edwards  Prior Living Arrangements/Services Living arrangements for the past 2 months: Single Family Home Lives with:: Spouse Patient language and need for interpreter reviewed:: Yes Do you feel safe going back to the place where you live?: Yes      Need for Family Participation in Patient Care: Yes (Comment) Care giver support system in place?: Yes (comment) Current home services: DME (cane, walker, wheelchair, BSC, shower  chair) Criminal Activity/Legal Involvement Pertinent to Current Situation/Hospitalization: No - Comment as needed  Activities of Daily Living   ADL Screening (condition at time of admission) Independently performs ADLs?: Yes (appropriate for developmental age) Is the patient deaf or have difficulty hearing?: No Does the patient have difficulty seeing, even when wearing glasses/contacts?: No Does the patient have difficulty concentrating, remembering, or making decisions?: No  Permission Sought/Granted Permission sought to share information with : Case Manager Permission granted to share information with : Yes, Verbal Permission Granted  Share Information with NAME: Case Manager     Permission granted to share info w Relationship: Anne Edwards (spouse) [phone]     Emotional Assessment Appearance:: Appears stated age Attitude/Demeanor/Rapport: Gracious Affect (typically observed): Accepting Orientation: : Oriented to Self, Oriented to Place, Oriented to  Time, Oriented to Situation Alcohol  / Substance Use: Not Applicable Psych Involvement: No (comment)  Admission diagnosis:  Failed total hip arthroplasty (HCC) [U15.981J, Z96.649] Patient Active Problem List   Diagnosis Date Noted   Failed total hip arthroplasty (HCC) 11/18/2023   History of colonic polyps 10/13/2023   Incontinence of feces 09/01/2023   Recent change in frequency of bowel movements 09/24/2022   Esophageal dysphagia 08/13/2022   Irritable bowel syndrome with both constipation and diarrhea 07/25/2021   Gastroesophageal reflux disease without esophagitis    Hiatal hernia    Nausea and vomiting 11/26/2020   Abdominal pain 11/26/2020   Absolute anemia 11/25/2018   Iron deficiency anemia 11/24/2018  PCP:  Anne Edwards, Anne E, PA-C Pharmacy:   [facility] [address] [place] [phone] [place] [address] Phone: [phone] Fax: [phone]     Social Drivers of  Health (SDOH) Social History: SDOH Screenings   Food Insecurity: No Food Insecurity (11/19/2023)  Housing: Low Risk  (11/19/2023)  Transportation Needs: No Transportation Needs (11/19/2023)  Utilities: Not At Risk (11/19/2023)  Financial Resource Strain: Low Risk  (03/02/2018)   Received from [facility], [facility]  Physical Activity: Sufficiently Active (03/02/2018)   [facility] from [facility], [facility]  Social Connections: Moderately Isolated (03/02/2018)   [facility] from [facility], [facility]  Stress: No Stress Concern Present (03/02/2018)   Received from [facility]  Tobacco Use: Low Risk  (11/18/2023)  Health Literacy: Low Risk  (01/20/2021)   Received from [facility], [facility]   SDOH Interventions: Food Insecurity Interventions: Intervention Not Indicated, Inpatient TOC Housing Interventions: Intervention Not Indicated, Inpatient TOC Transportation Interventions: Intervention Not Indicated, Inpatient TOC Utilities Interventions: Intervention Not Indicated, Inpatient TOC   Readmission Risk Interventions     No data to display

## 2023-11-19 NOTE — Progress Notes (Signed)
 Discharge paperwork printed and reviewed with the pt. Medication education provided. Pt and family made aware about the medication pick up from [facility]. Pt verbalized understanding and no concerns made. Pt transported to the car via wheelchair. Portable wound vac in place. Family received the belongings.

## 2023-11-19 NOTE — Progress Notes (Signed)
 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  Pt up to ambulate in hall, negotiated stairs, reviewed LB dressing and reviewed car transfers.  Pt eager for dc home this date.   If plan is discharge home, recommend the following: A little help with walking and/or transfers;A little help with bathing/dressing/bathroom;Assistance with cooking/housework;Assist for transportation;Help with stairs or ramp for entrance   Can travel by private vehicle        Equipment Recommendations  None recommended by PT    Recommendations for Other Services       Precautions / Restrictions Precautions Precautions: Fall;Posterior Hip Precaution Booklet Issued: Yes (comment) Precaution Comments: pt recalls 2/3 THP - all THP reviewed with pt and spouse Restrictions Weight Bearing Restrictions Per Provider Order: Yes LLE Weight Bearing Per Provider Order: Weight bearing as tolerated     Mobility  Bed Mobility Overal bed mobility: Needs Assistance Bed Mobility: Supine to Sit     Supine to sit: Contact guard     General bed mobility comments: INcreased time with cues for sequence and use of R LE to self assist    Transfers Overall transfer level: Needs assistance Equipment used: Rolling walker (2 wheels) Transfers: Sit to/from Stand Sit to Stand: Contact guard assist, Supervision           General transfer comment: cues for LE management and use of UEs to self assist    Ambulation/Gait Ambulation/Gait assistance: Contact guard assist, Supervision Gait Distance (Feet): 60 Feet Assistive device: Rolling walker (2 wheels) Gait Pattern/deviations: Step-to pattern, Step-through pattern, Decreased step length - right, Decreased step length - left, Shuffle,  Trunk flexed Gait velocity: decr     General Gait Details: cues for sequence, posture and position from RW   Stairs Stairs: Yes Stairs assistance: Min assist Stair Management: No rails, Two rails, Step to pattern, Backwards, Forwards, With walker Number of Stairs: 4 General stair comments: single step twice - once fwd and once bkwd with RW; 2 steps with bil rails - cues for seqeunce - spouse assisting   Wheelchair Mobility     Tilt Bed    Modified Rankin (Stroke Patients Only)       Balance Overall balance assessment: Needs assistance Sitting-balance support: No upper extremity supported, Feet supported Sitting balance-Leahy Scale: Good     Standing balance support: Single extremity supported Standing balance-Leahy Scale: Fair                              Cognition Arousal: Alert Behavior During Therapy: WFL for tasks assessed/performed Overall Cognitive Status: Within Functional Limits for tasks assessed                                          Exercises Total Joint Exercises Ankle Circles/Pumps: AROM, Both, 15 reps, Supine Quad Sets: AROM, Both, 10 reps, Supine Heel Slides: AAROM, Left, 20 reps, Supine Hip ABduction/ADduction: AAROM, Left, 15 reps, Supine    General Comments        Pertinent Vitals/Pain Pain Assessment Pain Assessment: 0-10 Pain Score: 5  Pain Location: L hip Pain Descriptors /  Indicators: Aching, Sore Pain Intervention(s): Limited activity within patient's tolerance, Monitored during session, Premedicated before session    Home Living Family/patient expects to be discharged to:: Private residence Living Arrangements: Spouse/significant other Available Help at Discharge: Family;Available 24 hours/day Type of Home: House Home Access: Stairs to enter Entrance Stairs-Rails: Right;Left;Can reach both Entrance Stairs-Number of Steps: 1+2 Alternate Level Stairs-Number of Steps: 1 Home Layout: Two  level Home Equipment: Agricultural Consultant (2 wheels);Cane - single point      Prior Function            PT Goals (current goals can now be found in the care plan section) Acute Rehab PT Goals Patient Stated Goal: Regain IND PT Goal Formulation: With patient Time For Goal Achievement: 11/26/23 Potential to Achieve Goals: Good Progress towards PT goals: Progressing toward goals    Frequency    7X/week      PT Plan      Co-evaluation              AM-PAC PT 6 Clicks Mobility   Outcome Measure  Help needed turning from your back to your side while in a flat bed without using bedrails?: A Little Help needed moving from lying on your back to sitting on the side of a flat bed without using bedrails?: A Little Help needed moving to and from a bed to a chair (including a wheelchair)?: A Little Help needed standing up from a chair using your arms (e.g., wheelchair or bedside chair)?: A Little Help needed to walk in hospital room?: A Little Help needed climbing 3-5 steps with a railing? : A Little 6 Click Score: 18    End of Session Equipment Utilized During Treatment: Gait belt Activity Tolerance: Patient tolerated treatment well Patient left: in chair;with call bell/phone within reach;with chair alarm set;with family/visitor present Nurse Communication: Mobility status PT Visit Diagnosis: Difficulty in walking, not elsewhere classified (R26.2)     Time: 8662-8595 PT Time Calculation (min) (ACUTE ONLY): 27 min  Charges:    $Gait Training: 8-22 mins $Therapeutic Exercise: 8-22 mins $Therapeutic Activity: 8-22 mins PT General Charges $$ ACUTE PT VISIT: 1 Visit                     Katrinka Acton PT Acute Rehabilitation Services Pager [phone] Office [phone]    Campbell Agramonte 11/19/2023, 2:41 PM

## 2023-11-19 NOTE — Plan of Care (Signed)

## 2023-11-20 DIAGNOSIS — R1314 Dysphagia, pharyngoesophageal phase: Secondary | ICD-10-CM | POA: Diagnosis not present

## 2023-11-20 DIAGNOSIS — F419 Anxiety disorder, unspecified: Secondary | ICD-10-CM | POA: Diagnosis not present

## 2023-11-20 DIAGNOSIS — M1990 Unspecified osteoarthritis, unspecified site: Secondary | ICD-10-CM | POA: Diagnosis not present

## 2023-11-20 DIAGNOSIS — K449 Diaphragmatic hernia without obstruction or gangrene: Secondary | ICD-10-CM | POA: Diagnosis not present

## 2023-11-20 DIAGNOSIS — D509 Iron deficiency anemia, unspecified: Secondary | ICD-10-CM | POA: Diagnosis not present

## 2023-11-20 DIAGNOSIS — K219 Gastro-esophageal reflux disease without esophagitis: Secondary | ICD-10-CM | POA: Diagnosis not present

## 2023-11-20 DIAGNOSIS — T84091A Other mechanical complication of internal left hip prosthesis, initial encounter: Secondary | ICD-10-CM | POA: Diagnosis not present

## 2023-11-20 DIAGNOSIS — K589 Irritable bowel syndrome without diarrhea: Secondary | ICD-10-CM | POA: Diagnosis not present

## 2023-11-20 DIAGNOSIS — K5900 Constipation, unspecified: Secondary | ICD-10-CM | POA: Diagnosis not present

## 2023-11-23 LAB — AEROBIC/ANAEROBIC CULTURE W GRAM STAIN (SURGICAL/DEEP WOUND)
Culture: NO GROWTH
Culture: NO GROWTH
Culture: NO GROWTH
Gram Stain: NONE SEEN
Gram Stain: NONE SEEN
Gram Stain: NONE SEEN

## 2023-11-25 DIAGNOSIS — K589 Irritable bowel syndrome without diarrhea: Secondary | ICD-10-CM | POA: Diagnosis not present

## 2023-11-25 DIAGNOSIS — K219 Gastro-esophageal reflux disease without esophagitis: Secondary | ICD-10-CM | POA: Diagnosis not present

## 2023-11-25 DIAGNOSIS — R1314 Dysphagia, pharyngoesophageal phase: Secondary | ICD-10-CM | POA: Diagnosis not present

## 2023-11-25 DIAGNOSIS — K449 Diaphragmatic hernia without obstruction or gangrene: Secondary | ICD-10-CM | POA: Diagnosis not present

## 2023-11-25 DIAGNOSIS — F419 Anxiety disorder, unspecified: Secondary | ICD-10-CM | POA: Diagnosis not present

## 2023-11-25 DIAGNOSIS — D509 Iron deficiency anemia, unspecified: Secondary | ICD-10-CM | POA: Diagnosis not present

## 2023-11-25 DIAGNOSIS — T84091A Other mechanical complication of internal left hip prosthesis, initial encounter: Secondary | ICD-10-CM | POA: Diagnosis not present

## 2023-11-25 DIAGNOSIS — K5900 Constipation, unspecified: Secondary | ICD-10-CM | POA: Diagnosis not present

## 2023-11-25 DIAGNOSIS — M1990 Unspecified osteoarthritis, unspecified site: Secondary | ICD-10-CM | POA: Diagnosis not present

## 2023-11-26 DIAGNOSIS — M25552 Pain in left hip: Secondary | ICD-10-CM | POA: Diagnosis not present

## 2023-11-26 DIAGNOSIS — T84031D Mechanical loosening of internal left hip prosthetic joint, subsequent encounter: Secondary | ICD-10-CM | POA: Diagnosis not present

## 2023-11-27 DIAGNOSIS — F419 Anxiety disorder, unspecified: Secondary | ICD-10-CM | POA: Diagnosis not present

## 2023-11-27 DIAGNOSIS — K449 Diaphragmatic hernia without obstruction or gangrene: Secondary | ICD-10-CM | POA: Diagnosis not present

## 2023-11-27 DIAGNOSIS — T84091A Other mechanical complication of internal left hip prosthesis, initial encounter: Secondary | ICD-10-CM | POA: Diagnosis not present

## 2023-11-27 DIAGNOSIS — M1990 Unspecified osteoarthritis, unspecified site: Secondary | ICD-10-CM | POA: Diagnosis not present

## 2023-11-27 DIAGNOSIS — R1314 Dysphagia, pharyngoesophageal phase: Secondary | ICD-10-CM | POA: Diagnosis not present

## 2023-11-27 DIAGNOSIS — K219 Gastro-esophageal reflux disease without esophagitis: Secondary | ICD-10-CM | POA: Diagnosis not present

## 2023-11-27 DIAGNOSIS — K589 Irritable bowel syndrome without diarrhea: Secondary | ICD-10-CM | POA: Diagnosis not present

## 2023-11-27 DIAGNOSIS — D509 Iron deficiency anemia, unspecified: Secondary | ICD-10-CM | POA: Diagnosis not present

## 2023-11-27 DIAGNOSIS — K5900 Constipation, unspecified: Secondary | ICD-10-CM | POA: Diagnosis not present

## 2023-12-02 DIAGNOSIS — T84091A Other mechanical complication of internal left hip prosthesis, initial encounter: Secondary | ICD-10-CM | POA: Diagnosis not present

## 2023-12-02 DIAGNOSIS — K589 Irritable bowel syndrome without diarrhea: Secondary | ICD-10-CM | POA: Diagnosis not present

## 2023-12-02 DIAGNOSIS — D509 Iron deficiency anemia, unspecified: Secondary | ICD-10-CM | POA: Diagnosis not present

## 2023-12-02 DIAGNOSIS — M1990 Unspecified osteoarthritis, unspecified site: Secondary | ICD-10-CM | POA: Diagnosis not present

## 2023-12-02 DIAGNOSIS — R1314 Dysphagia, pharyngoesophageal phase: Secondary | ICD-10-CM | POA: Diagnosis not present

## 2023-12-02 DIAGNOSIS — K5900 Constipation, unspecified: Secondary | ICD-10-CM | POA: Diagnosis not present

## 2023-12-02 DIAGNOSIS — F419 Anxiety disorder, unspecified: Secondary | ICD-10-CM | POA: Diagnosis not present

## 2023-12-02 DIAGNOSIS — K219 Gastro-esophageal reflux disease without esophagitis: Secondary | ICD-10-CM | POA: Diagnosis not present

## 2023-12-02 DIAGNOSIS — K449 Diaphragmatic hernia without obstruction or gangrene: Secondary | ICD-10-CM | POA: Diagnosis not present

## 2023-12-03 ENCOUNTER — Other Ambulatory Visit (HOSPITAL_COMMUNITY): Payer: Medicare HMO

## 2023-12-04 DIAGNOSIS — D509 Iron deficiency anemia, unspecified: Secondary | ICD-10-CM | POA: Diagnosis not present

## 2023-12-04 DIAGNOSIS — K589 Irritable bowel syndrome without diarrhea: Secondary | ICD-10-CM | POA: Diagnosis not present

## 2023-12-04 DIAGNOSIS — K5900 Constipation, unspecified: Secondary | ICD-10-CM | POA: Diagnosis not present

## 2023-12-04 DIAGNOSIS — K219 Gastro-esophageal reflux disease without esophagitis: Secondary | ICD-10-CM | POA: Diagnosis not present

## 2023-12-04 DIAGNOSIS — R1314 Dysphagia, pharyngoesophageal phase: Secondary | ICD-10-CM | POA: Diagnosis not present

## 2023-12-04 DIAGNOSIS — K449 Diaphragmatic hernia without obstruction or gangrene: Secondary | ICD-10-CM | POA: Diagnosis not present

## 2023-12-04 DIAGNOSIS — F419 Anxiety disorder, unspecified: Secondary | ICD-10-CM | POA: Diagnosis not present

## 2023-12-04 DIAGNOSIS — M1990 Unspecified osteoarthritis, unspecified site: Secondary | ICD-10-CM | POA: Diagnosis not present

## 2023-12-04 DIAGNOSIS — T84091A Other mechanical complication of internal left hip prosthesis, initial encounter: Secondary | ICD-10-CM | POA: Diagnosis not present

## 2023-12-07 DIAGNOSIS — K219 Gastro-esophageal reflux disease without esophagitis: Secondary | ICD-10-CM | POA: Diagnosis not present

## 2023-12-07 DIAGNOSIS — D509 Iron deficiency anemia, unspecified: Secondary | ICD-10-CM | POA: Diagnosis not present

## 2023-12-07 DIAGNOSIS — K589 Irritable bowel syndrome without diarrhea: Secondary | ICD-10-CM | POA: Diagnosis not present

## 2023-12-07 DIAGNOSIS — F419 Anxiety disorder, unspecified: Secondary | ICD-10-CM | POA: Diagnosis not present

## 2023-12-07 DIAGNOSIS — M1990 Unspecified osteoarthritis, unspecified site: Secondary | ICD-10-CM | POA: Diagnosis not present

## 2023-12-07 DIAGNOSIS — T84091A Other mechanical complication of internal left hip prosthesis, initial encounter: Secondary | ICD-10-CM | POA: Diagnosis not present

## 2023-12-07 DIAGNOSIS — K5900 Constipation, unspecified: Secondary | ICD-10-CM | POA: Diagnosis not present

## 2023-12-07 DIAGNOSIS — R1314 Dysphagia, pharyngoesophageal phase: Secondary | ICD-10-CM | POA: Diagnosis not present

## 2023-12-07 DIAGNOSIS — K449 Diaphragmatic hernia without obstruction or gangrene: Secondary | ICD-10-CM | POA: Diagnosis not present

## 2023-12-09 ENCOUNTER — Other Ambulatory Visit (HOSPITAL_COMMUNITY): Payer: Medicare HMO

## 2023-12-09 DIAGNOSIS — T84031D Mechanical loosening of internal left hip prosthetic joint, subsequent encounter: Secondary | ICD-10-CM | POA: Diagnosis not present

## 2023-12-10 DIAGNOSIS — F419 Anxiety disorder, unspecified: Secondary | ICD-10-CM | POA: Diagnosis not present

## 2023-12-10 DIAGNOSIS — K589 Irritable bowel syndrome without diarrhea: Secondary | ICD-10-CM | POA: Diagnosis not present

## 2023-12-10 DIAGNOSIS — T84091A Other mechanical complication of internal left hip prosthesis, initial encounter: Secondary | ICD-10-CM | POA: Diagnosis not present

## 2023-12-10 DIAGNOSIS — M1990 Unspecified osteoarthritis, unspecified site: Secondary | ICD-10-CM | POA: Diagnosis not present

## 2023-12-10 DIAGNOSIS — R1314 Dysphagia, pharyngoesophageal phase: Secondary | ICD-10-CM | POA: Diagnosis not present

## 2023-12-10 DIAGNOSIS — D509 Iron deficiency anemia, unspecified: Secondary | ICD-10-CM | POA: Diagnosis not present

## 2023-12-10 DIAGNOSIS — K449 Diaphragmatic hernia without obstruction or gangrene: Secondary | ICD-10-CM | POA: Diagnosis not present

## 2023-12-10 DIAGNOSIS — K219 Gastro-esophageal reflux disease without esophagitis: Secondary | ICD-10-CM | POA: Diagnosis not present

## 2023-12-10 DIAGNOSIS — K5900 Constipation, unspecified: Secondary | ICD-10-CM | POA: Diagnosis not present

## 2023-12-15 ENCOUNTER — Encounter (HOSPITAL_COMMUNITY): Payer: Self-pay | Admitting: Emergency Medicine

## 2023-12-15 ENCOUNTER — Emergency Department (HOSPITAL_COMMUNITY)

## 2023-12-15 ENCOUNTER — Other Ambulatory Visit: Payer: Self-pay

## 2023-12-15 ENCOUNTER — Emergency Department (HOSPITAL_COMMUNITY)
Admission: EM | Admit: 2023-12-15 | Discharge: 2023-12-16 | Disposition: A | Discharge status: Home / Self Care | Attending: Emergency Medicine | Admitting: Emergency Medicine

## 2023-12-15 DIAGNOSIS — X501XXA Overexertion from prolonged static or awkward postures, initial encounter: Secondary | ICD-10-CM | POA: Diagnosis not present

## 2023-12-15 DIAGNOSIS — I878 Other specified disorders of veins: Secondary | ICD-10-CM | POA: Diagnosis not present

## 2023-12-15 DIAGNOSIS — Z96642 Presence of left artificial hip joint: Secondary | ICD-10-CM | POA: Diagnosis not present

## 2023-12-15 DIAGNOSIS — S73005A Unspecified dislocation of left hip, initial encounter: Secondary | ICD-10-CM | POA: Insufficient documentation

## 2023-12-15 DIAGNOSIS — I10 Essential (primary) hypertension: Secondary | ICD-10-CM | POA: Diagnosis not present

## 2023-12-15 DIAGNOSIS — M25552 Pain in left hip: Secondary | ICD-10-CM | POA: Diagnosis not present

## 2023-12-15 DIAGNOSIS — T84021A Dislocation of internal left hip prosthesis, initial encounter: Secondary | ICD-10-CM | POA: Diagnosis not present

## 2023-12-15 LAB — BASIC METABOLIC PANEL
Anion gap: 8 (ref 5–15)
BUN: 11 mg/dL (ref 8–23)
CO2: 24 mmol/L (ref 22–32)
Calcium: 8.4 mg/dL — ABNORMAL LOW (ref 8.9–10.3)
Chloride: 109 mmol/L (ref 98–111)
Creatinine, Ser: 0.47 mg/dL (ref 0.44–1.00)
GFR, Estimated: >60 mL/min (ref >60)
Glucose, Bld: 104 mg/dL — ABNORMAL HIGH (ref 70–99)
Potassium: 3.3 mmol/L — ABNORMAL LOW (ref 3.5–5.1)
Sodium: 141 mmol/L (ref 135–145)

## 2023-12-15 LAB — CBC WITH DIFFERENTIAL/PLATELET
Abs Immature Granulocytes: 0.02 10*3/uL (ref 0.00–0.07)
Basophils Absolute: 0.1 10*3/uL (ref 0.0–0.1)
Basophils Relative: 1 %
Eosinophils Absolute: 0.3 10*3/uL (ref 0.0–0.5)
Eosinophils Relative: 5 %
HCT: 36.5 % (ref 36.0–46.0)
Hemoglobin: 11.4 g/dL — ABNORMAL LOW (ref 12.0–15.0)
Immature Granulocytes: 0 %
Lymphocytes Relative: 26 %
Lymphs Abs: 1.4 10*3/uL (ref 0.7–4.0)
MCH: 27.3 pg (ref 26.0–34.0)
MCHC: 31.2 g/dL (ref 30.0–36.0)
MCV: 87.3 fL (ref 80.0–100.0)
Monocytes Absolute: 0.6 10*3/uL (ref 0.1–1.0)
Monocytes Relative: 11 %
Neutro Abs: 3.2 10*3/uL (ref 1.7–7.7)
Neutrophils Relative %: 57 %
Platelets: 184 10*3/uL (ref 150–400)
RBC: 4.18 MIL/uL (ref 3.87–5.11)
RDW: 14.4 % (ref 11.5–15.5)
WBC: 5.5 10*3/uL (ref 4.0–10.5)
nRBC: 0 % (ref 0.0–0.2)

## 2023-12-15 LAB — PROTIME-INR
INR: 1.1 (ref 0.8–1.2)
Prothrombin Time: 14.2 s (ref 11.4–15.2)

## 2023-12-15 LAB — TYPE AND SCREEN
ABO/RH(D): O POS
Antibody Screen: NEGATIVE

## 2023-12-15 MED ORDER — PROPOFOL 10 MG/ML IV BOLUS
1.0000 mg/kg | Freq: Once | INTRAVENOUS | Status: AC
  Administered 2023-12-15: 80 mg via INTRAVENOUS
  Filled 2023-12-15: qty 20

## 2023-12-15 MED ORDER — PROPOFOL 10 MG/ML IV BOLUS
INTRAVENOUS | Status: AC | PRN
  Administered 2023-12-15: 30 mg via INTRAVENOUS
  Administered 2023-12-15: 50 mg via INTRAVENOUS
  Administered 2023-12-15: 20 mg via INTRAVENOUS

## 2023-12-15 MED ORDER — FENTANYL CITRATE PF 50 MCG/ML IJ SOSY
75.0000 ug | PREFILLED_SYRINGE | Freq: Once | INTRAMUSCULAR | Status: AC
  Administered 2023-12-15: 75 ug via INTRAVENOUS
  Filled 2023-12-15: qty 2

## 2023-12-15 NOTE — Discharge Instructions (Addendum)
 You were seen in the ER for hip pain.  Your hip had dislocated.  The hip was reduced.  Keep the knee immobilizer on at all times.  Call the orthopedic surgeon in the morning, and request that you seen this week.

## 2023-12-15 NOTE — ED Provider Notes (Signed)
.  Reduction of dislocation  Date/Time: 12/15/2023 11:13 PM  Performed by: Netta Corrigan, PA-C Authorized by: Netta Corrigan, PA-C  Consent: Verbal consent obtained. Written consent obtained. Risks and benefits: risks, benefits and alternatives were discussed Consent given by: patient Patient understanding: patient states understanding of the procedure being performed Patient consent: the patient's understanding of the procedure matches consent given Procedure consent: procedure consent matches procedure scheduled Relevant documents: relevant documents present and verified Imaging studies: imaging studies available Required items: required blood products, implants, devices, and special equipment available Patient identity confirmed: verbally with patient Time out: Immediately prior to procedure a "time out" was called to verify the correct patient, procedure, equipment, support staff and site/side marked as required. Local anesthesia used: no  Anesthesia: Local anesthesia used: no  Sedation: Patient sedated: yes Sedation type: moderate (conscious) sedation Sedatives: propofol Vitals: Vital signs were monitored during sedation.  Patient tolerance: patient tolerated the procedure well with no immediate complications       Anne Edwards 12/15/23 2315    Derwood Kaplan, MD 12/15/23 2321

## 2023-12-15 NOTE — ED Provider Notes (Signed)
  Physical Exam  BP (!) 133/56   Pulse 74   Temp 97.7 F (36.5 C)   Resp 13   Wt 80 kg   SpO2 100%   BMI 32.26 kg/m   Physical Exam  Procedures  .Sedation  Date/Time: 12/15/2023 11:24 PM  Performed by: Charlynne Pander, MD Authorized by: Charlynne Pander, MD   Consent:    Consent obtained:  Written   Consent given by:  Patient   Alternatives discussed:  Analgesia without sedation Universal protocol:    Immediately prior to procedure, a time out was called: yes   Pre-sedation assessment:    Time since last food or drink:  5 hour   ASA classification: class 1 - normal, healthy patient     Mallampati score:  I - soft palate, uvula, fauces, pillars visible   Pre-sedation assessments completed and reviewed: airway patency   A pre-sedation assessment was completed prior to the start of the procedure Immediate pre-procedure details:    Reassessment: Patient reassessed immediately prior to procedure     Reviewed: vital signs   Procedure details (see MAR for exact dosages):    Sedation:  Propofol   Intended level of sedation: deep   Total Provider sedation time (minutes):  30 Post-procedure details:   A post-sedation assessment was completed following the completion of the procedure.   Attendance: Constant attendance by certified staff until patient recovered     Recovery: Patient returned to pre-procedure baseline     Procedure completion:  Tolerated well, no immediate complications   ED Course / MDM    Medical Decision Making I performed sedation while Dr. Rhunette Croft perform hip reduction   Amount and/or Complexity of Data Reviewed Labs: ordered. Radiology: ordered.  Risk Prescription drug management.          Charlynne Pander, MD 12/15/23 [phone]

## 2023-12-15 NOTE — ED Triage Notes (Signed)
 Pt states she had surgery on her left hip on 11/18/23, released from physical therapy today. Says she stood up and felt a "pop". Unable to move leg at this time. Did not fall.

## 2023-12-15 NOTE — ED Provider Notes (Signed)
 [place] EMERGENCY DEPARTMENT AT [facility] Provider Note   CSN: [id number] Arrival date & time: 12/15/23  2042     History  Chief Complaint  Patient presents with   Hip Pain    left    Anne Edwards is a 63 y.o. female.  HPI    63 year old female comes in with chief complaint of hip pain.  Patient states that she had her hip replacement early in February.  Today she just got up from her chair, and she said she started having instant pain.  Patient unable to walk since then.  No fall and no injury elsewhere.  Patient denies any numbness or tingling.  Home Medications Prior to Admission medications   Medication Sig Start Date End Date Taking? Authorizing Provider  acetaminophen (TYLENOL) 500 MG tablet Take 2 tablets (1,000 mg total) by mouth every 8 (eight) hours as needed. 11/18/23 12/18/23  Cecil Cobbs, PA-C  aspirin EC 81 MG tablet Take 1 tablet (81 mg total) by mouth 2 (two) times daily for 28 days. Swallow whole. 11/19/23 12/17/23  Cecil Cobbs, PA-C  clonazePAM (KLONOPIN) 1 MG tablet Take 1 mg by mouth at bedtime.    [provider]  cyclobenzaprine (FLEXERIL) 10 MG tablet Take 1 tablet (10 mg total) by mouth 3 (three) times daily as needed for muscle spasms. 11/18/23   Cecil Cobbs, PA-C  dicyclomine (BENTYL) 10 MG capsule Take 1 capsule (10 mg total) by mouth every 12 (twelve) hours as needed for spasms (abdominal pain). Patient not taking: Reported on 11/03/2023 09/24/22   Marguerita Merles, Reuel Boom, MD  escitalopram (LEXAPRO) 20 MG tablet Take 20 mg by mouth daily.    [provider]  pantoprazole (PROTONIX) 40 MG tablet Take 1 tablet (40 mg total) by mouth 2 (two) times daily. Patient not taking: Reported on 11/03/2023 09/01/23   Doylene Bode L, NP  polyethylene glycol (MIRALAX) 17 g packet Take 17 g by mouth daily. 11/18/23   Cecil Cobbs, PA-C      Allergies    Patient has no known allergies.    Review of Systems    Review of Systems  All other systems reviewed and are negative.   Physical Exam Updated Vital Signs BP (!) 149/71   Pulse 75   Temp 97.7 F (36.5 C)   Resp 14   Wt 80 kg   SpO2 98%   BMI 32.26 kg/m  Physical Exam Vitals and nursing note reviewed.  Constitutional:      Appearance: She is well-developed.  HENT:     Head: Normocephalic and atraumatic.  Eyes:     Extraocular Movements: Extraocular movements intact.     Pupils: Pupils are equal, round, and reactive to light.  Neck:     Comments: No midline c-spine tenderness Cardiovascular:     Rate and Rhythm: Normal rate and regular rhythm.  Pulmonary:     Effort: Pulmonary effort is normal.     Breath sounds: Normal breath sounds.  Abdominal:     Palpations: Abdomen is soft.     Tenderness: There is no abdominal tenderness.  Musculoskeletal:        General: Tenderness and deformity present.     Cervical back: Neck supple. No tenderness.     Comments: Left lower extremity shortened  Skin:    General: Skin is warm and dry.     Findings: No rash.  Neurological:     Mental Status: She is alert and  oriented to person, place, and time.     Cranial Nerves: No cranial nerve deficit.     ED Results / Procedures / Treatments   Labs (all labs ordered are listed, but only abnormal results are displayed) Labs Reviewed  BASIC METABOLIC PANEL - Abnormal; Notable for the following components:      Result Value   Potassium 3.3 (*)    Glucose, Bld 104 (*)    Calcium 8.4 (*)    All other components within normal limits  CBC WITH DIFFERENTIAL/PLATELET - Abnormal; Notable for the following components:   Hemoglobin 11.4 (*)    All other components within normal limits  PROTIME-INR  TYPE AND SCREEN    EKG None  Radiology No results found.  Procedures Procedures    Medications Ordered in ED Medications  propofol (DIPRIVAN) 10 mg/mL bolus/IV push 80 mg (has no administration in time range)  fentaNYL (SUBLIMAZE)  injection 75 mcg (75 mcg Intravenous Given 12/15/23 2121)    ED Course/ Medical Decision Making/ A&P                                 Medical Decision Making Amount and/or Complexity of Data Reviewed Labs: ordered. Radiology: ordered.  Risk Prescription drug management.   63 year old patient comes in after sustaining what appears to be a mechanical fall. Pertinent past medical includes recent hip replacement on the same side.   Based on my history and exam, differential diagnosis includes: -Hip dislocation - Long bone fractures - Soft tissue injury  Based on the initial assessment, the following workup was initiated suspect that most likely patient has hip dislocation.  X-ray of the hip ordered.  I have independently interpreted the following imaging from the perspective of acute trauma: X-ray of the hip and the results indicate clear evidence of hip dislocation.  I discussed the case with Dr. Jena Gauss, orthopedic surgery.  He is comfortable with [place] reducing the hip in the ER.  Patient is consented for the procedure.  Dr. Silverio Lay will do the procedural sedation.  11:33 PM Patient's hip x-ray looks reassuring. Stable for discharge when she is more alert.  Final Clinical Impression(s) / ED Diagnoses Final diagnoses:  Dislocation of left hip, initial encounter [facility])    Rx / DC Orders ED Discharge Orders     None         Derwood Kaplan, MD 12/15/23 2333

## 2023-12-17 DIAGNOSIS — T84031D Mechanical loosening of internal left hip prosthetic joint, subsequent encounter: Secondary | ICD-10-CM | POA: Diagnosis not present

## 2023-12-18 DIAGNOSIS — M24452 Recurrent dislocation, left hip: Secondary | ICD-10-CM | POA: Diagnosis not present

## 2023-12-24 DIAGNOSIS — T84031D Mechanical loosening of internal left hip prosthetic joint, subsequent encounter: Secondary | ICD-10-CM | POA: Diagnosis not present

## 2023-12-28 ENCOUNTER — Ambulatory Visit (INDEPENDENT_AMBULATORY_CARE_PROVIDER_SITE_OTHER): Payer: Medicare HMO | Admitting: Gastroenterology

## 2024-01-21 DIAGNOSIS — T84031D Mechanical loosening of internal left hip prosthetic joint, subsequent encounter: Secondary | ICD-10-CM | POA: Diagnosis not present

## 2024-01-25 ENCOUNTER — Telehealth (INDEPENDENT_AMBULATORY_CARE_PROVIDER_SITE_OTHER): Payer: Self-pay

## 2024-01-25 DIAGNOSIS — K449 Diaphragmatic hernia without obstruction or gangrene: Secondary | ICD-10-CM | POA: Diagnosis not present

## 2024-01-25 NOTE — Telephone Encounter (Signed)
 Ann, can we send again a referral to [facility] to do EGD with placement of pH impedance probe and manometry as they could not do this in a regular fashion? Thanks

## 2024-01-25 NOTE — Telephone Encounter (Signed)
 Noted.

## 2024-01-25 NOTE — Telephone Encounter (Signed)
 Hilda with [facility] [phone] called to report they were unable to do the Manometry and the [facility] impedence testing on patient.  They say they tried to do the Ph impedence on the patient as asked while on ppi, they say the patient had reported she was off of the ppi, so they did not proceed with the testing. They says they also tried several times to do the manometry,but they were unable to proceed due to the hernia.

## 2024-01-26 NOTE — Telephone Encounter (Signed)
On or off PPI 

## 2024-01-26 NOTE — Telephone Encounter (Signed)
 ON PPI Thanks

## 2024-01-26 NOTE — Telephone Encounter (Signed)
 Ok, thanks  Referral sent, they will contact patient with apt

## 2024-01-27 DIAGNOSIS — Z1231 Encounter for screening mammogram for malignant neoplasm of breast: Secondary | ICD-10-CM | POA: Diagnosis not present

## 2024-02-12 DIAGNOSIS — F419 Anxiety disorder, unspecified: Secondary | ICD-10-CM | POA: Diagnosis not present

## 2024-02-12 DIAGNOSIS — Z6832 Body mass index (BMI) 32.0-32.9, adult: Secondary | ICD-10-CM | POA: Diagnosis not present

## 2024-02-12 DIAGNOSIS — R300 Dysuria: Secondary | ICD-10-CM | POA: Diagnosis not present

## 2024-02-12 DIAGNOSIS — J019 Acute sinusitis, unspecified: Secondary | ICD-10-CM | POA: Diagnosis not present

## 2024-02-12 DIAGNOSIS — K449 Diaphragmatic hernia without obstruction or gangrene: Secondary | ICD-10-CM | POA: Diagnosis not present

## 2024-02-12 DIAGNOSIS — Z20828 Contact with and (suspected) exposure to other viral communicable diseases: Secondary | ICD-10-CM | POA: Diagnosis not present

## 2024-02-15 DIAGNOSIS — M4126 Other idiopathic scoliosis, lumbar region: Secondary | ICD-10-CM | POA: Diagnosis not present

## 2024-02-15 DIAGNOSIS — M4316 Spondylolisthesis, lumbar region: Secondary | ICD-10-CM | POA: Diagnosis not present

## 2024-02-15 DIAGNOSIS — Z6832 Body mass index (BMI) 32.0-32.9, adult: Secondary | ICD-10-CM | POA: Diagnosis not present

## 2024-02-15 DIAGNOSIS — M5416 Radiculopathy, lumbar region: Secondary | ICD-10-CM | POA: Diagnosis not present

## 2024-02-16 ENCOUNTER — Other Ambulatory Visit: Payer: Self-pay | Admitting: Neurosurgery

## 2024-02-16 DIAGNOSIS — M5416 Radiculopathy, lumbar region: Secondary | ICD-10-CM

## 2024-02-17 ENCOUNTER — Encounter: Payer: Self-pay | Admitting: Neurosurgery

## 2024-02-24 ENCOUNTER — Ambulatory Visit
Admission: RE | Admit: 2024-02-24 | Discharge: 2024-02-24 | Disposition: A | Discharge status: Home / Self Care | Source: Ambulatory Visit | Attending: Neurosurgery | Admitting: Neurosurgery

## 2024-02-24 DIAGNOSIS — M5126 Other intervertebral disc displacement, lumbar region: Secondary | ICD-10-CM | POA: Diagnosis not present

## 2024-02-24 DIAGNOSIS — M419 Scoliosis, unspecified: Secondary | ICD-10-CM | POA: Diagnosis not present

## 2024-02-24 DIAGNOSIS — M4726 Other spondylosis with radiculopathy, lumbar region: Secondary | ICD-10-CM | POA: Diagnosis not present

## 2024-02-24 DIAGNOSIS — M5416 Radiculopathy, lumbar region: Secondary | ICD-10-CM

## 2024-03-03 DIAGNOSIS — M5416 Radiculopathy, lumbar region: Secondary | ICD-10-CM | POA: Diagnosis not present

## 2024-03-03 DIAGNOSIS — T84031D Mechanical loosening of internal left hip prosthetic joint, subsequent encounter: Secondary | ICD-10-CM | POA: Diagnosis not present

## 2024-03-10 DIAGNOSIS — D649 Anemia, unspecified: Secondary | ICD-10-CM | POA: Diagnosis not present

## 2024-03-10 DIAGNOSIS — N952 Postmenopausal atrophic vaginitis: Secondary | ICD-10-CM | POA: Diagnosis not present

## 2024-03-10 DIAGNOSIS — Z6832 Body mass index (BMI) 32.0-32.9, adult: Secondary | ICD-10-CM | POA: Diagnosis not present

## 2024-03-10 DIAGNOSIS — N200 Calculus of kidney: Secondary | ICD-10-CM | POA: Diagnosis not present

## 2024-03-10 DIAGNOSIS — N951 Menopausal and female climacteric states: Secondary | ICD-10-CM | POA: Diagnosis not present

## 2024-03-10 DIAGNOSIS — F419 Anxiety disorder, unspecified: Secondary | ICD-10-CM | POA: Diagnosis not present

## 2024-03-10 DIAGNOSIS — E039 Hypothyroidism, unspecified: Secondary | ICD-10-CM | POA: Diagnosis not present

## 2024-03-23 DIAGNOSIS — K5902 Outlet dysfunction constipation: Secondary | ICD-10-CM | POA: Diagnosis not present

## 2024-03-23 DIAGNOSIS — R159 Full incontinence of feces: Secondary | ICD-10-CM | POA: Diagnosis not present

## 2024-03-29 ENCOUNTER — Telehealth (INDEPENDENT_AMBULATORY_CARE_PROVIDER_SITE_OTHER): Payer: Self-pay | Admitting: Gastroenterology

## 2024-03-29 DIAGNOSIS — R159 Full incontinence of feces: Secondary | ICD-10-CM | POA: Diagnosis not present

## 2024-03-29 DIAGNOSIS — K5902 Outlet dysfunction constipation: Secondary | ICD-10-CM | POA: Diagnosis not present

## 2024-03-29 DIAGNOSIS — M6289 Other specified disorders of muscle: Secondary | ICD-10-CM | POA: Diagnosis not present

## 2024-03-29 NOTE — Telephone Encounter (Signed)
 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  He it was considered that this may lead to incomplete emptying and overflow incontinence.  She also had some hypersensitivity.  Explained these findings to the patient.  Hi Ann, can you please refer the patient to [facility] for pelvic floor and biofeedback therapy?  Diagnosis dyssynergic defecation.  Thanks,   Samantha Cress, MD Gastroenterology and Hepatology [facility] Gastroenterology

## 2024-04-04 NOTE — Telephone Encounter (Signed)
 Referral sent, they will contact patient with apt

## 2024-04-05 DIAGNOSIS — M79672 Pain in left foot: Secondary | ICD-10-CM | POA: Diagnosis not present

## 2024-04-05 DIAGNOSIS — Z6832 Body mass index (BMI) 32.0-32.9, adult: Secondary | ICD-10-CM | POA: Diagnosis not present

## 2024-04-05 DIAGNOSIS — R5383 Other fatigue: Secondary | ICD-10-CM | POA: Diagnosis not present

## 2024-04-05 DIAGNOSIS — M109 Gout, unspecified: Secondary | ICD-10-CM | POA: Diagnosis not present

## 2024-04-11 DIAGNOSIS — M4126 Other idiopathic scoliosis, lumbar region: Secondary | ICD-10-CM | POA: Diagnosis not present

## 2024-04-14 DIAGNOSIS — Z6832 Body mass index (BMI) 32.0-32.9, adult: Secondary | ICD-10-CM | POA: Diagnosis not present

## 2024-04-14 DIAGNOSIS — M79672 Pain in left foot: Secondary | ICD-10-CM | POA: Diagnosis not present

## 2024-04-14 DIAGNOSIS — M7989 Other specified soft tissue disorders: Secondary | ICD-10-CM | POA: Diagnosis not present

## 2024-04-19 DIAGNOSIS — R2242 Localized swelling, mass and lump, left lower limb: Secondary | ICD-10-CM | POA: Diagnosis not present

## 2024-04-19 DIAGNOSIS — R600 Localized edema: Secondary | ICD-10-CM | POA: Diagnosis not present

## 2024-04-29 DIAGNOSIS — N3000 Acute cystitis without hematuria: Secondary | ICD-10-CM | POA: Diagnosis not present

## 2024-04-29 DIAGNOSIS — R3915 Urgency of urination: Secondary | ICD-10-CM | POA: Diagnosis not present

## 2024-04-29 DIAGNOSIS — R350 Frequency of micturition: Secondary | ICD-10-CM | POA: Diagnosis not present

## 2024-05-16 DIAGNOSIS — M25561 Pain in right knee: Secondary | ICD-10-CM | POA: Diagnosis not present

## 2024-05-26 ENCOUNTER — Ambulatory Visit: Admitting: Physical Therapy

## 2024-07-04 DIAGNOSIS — R609 Edema, unspecified: Secondary | ICD-10-CM | POA: Diagnosis not present

## 2024-07-04 DIAGNOSIS — Z6833 Body mass index (BMI) 33.0-33.9, adult: Secondary | ICD-10-CM | POA: Diagnosis not present

## 2024-07-11 DIAGNOSIS — K219 Gastro-esophageal reflux disease without esophagitis: Secondary | ICD-10-CM | POA: Diagnosis not present

## 2024-07-11 DIAGNOSIS — K2950 Unspecified chronic gastritis without bleeding: Secondary | ICD-10-CM | POA: Diagnosis not present

## 2024-07-11 DIAGNOSIS — K449 Diaphragmatic hernia without obstruction or gangrene: Secondary | ICD-10-CM | POA: Diagnosis not present

## 2024-07-11 DIAGNOSIS — R12 Heartburn: Secondary | ICD-10-CM | POA: Diagnosis not present

## 2024-07-11 DIAGNOSIS — Z7952 Long term (current) use of systemic steroids: Secondary | ICD-10-CM | POA: Diagnosis not present

## 2024-07-11 DIAGNOSIS — Z79899 Other long term (current) drug therapy: Secondary | ICD-10-CM | POA: Diagnosis not present

## 2024-07-11 DIAGNOSIS — K220 Achalasia of cardia: Secondary | ICD-10-CM | POA: Diagnosis not present

## 2024-07-11 DIAGNOSIS — K2970 Gastritis, unspecified, without bleeding: Secondary | ICD-10-CM | POA: Diagnosis not present

## 2024-07-12 ENCOUNTER — Telehealth (INDEPENDENT_AMBULATORY_CARE_PROVIDER_SITE_OTHER): Payer: Self-pay | Admitting: Gastroenterology

## 2024-07-12 NOTE — Telephone Encounter (Signed)
 Spoke with patient regarding results of most recent  results of most recent manometry which showed pan esophageal pressurization with 10 out of 10 failed swallows.  However IRP was low at 7.  This raises the concern for possible scleroderma versus achalasia, although I consider this is less likely as the IRP was very low.  We discussed that she she will keep dysphagia modifications to avoid issues with vomiting or choking.  However, I explained that if her symptoms were to worsen, we may need to consider placement of a PEG tube.  For now, we will evaluate her esophageal abnormalities further with time barium swallow achalasia protocol.  Autumn/Mindy/Joeann,  Can you please schedule a timed barium esophagram double contrast (please put in comments achalasia protocol) ? Dx: dysphagia, abnormal manometry.   Thanks,  Toribio Fortune, MD Gastroenterology and Hepatology [facility] Gastroenterology

## 2024-07-13 ENCOUNTER — Other Ambulatory Visit (INDEPENDENT_AMBULATORY_CARE_PROVIDER_SITE_OTHER): Payer: Self-pay

## 2024-07-13 DIAGNOSIS — R1319 Other dysphagia: Secondary | ICD-10-CM

## 2024-07-13 NOTE — Telephone Encounter (Signed)
 It should only be esophagram double contrast, no need for speech and swallow or modified barium swallow. Just add in the order comments achalasia protocol. Thanks

## 2024-07-13 NOTE — Telephone Encounter (Signed)
 ATC patient to give her appointment for the timed barium esophagram, patient did not answer. Left pt a vm giving the appointment for 07/15/2024 at 9:30am and NPO 3 hours.

## 2024-07-15 ENCOUNTER — Ambulatory Visit (HOSPITAL_COMMUNITY)

## 2024-07-20 ENCOUNTER — Other Ambulatory Visit (INDEPENDENT_AMBULATORY_CARE_PROVIDER_SITE_OTHER): Payer: Self-pay | Admitting: Gastroenterology

## 2024-07-20 ENCOUNTER — Ambulatory Visit (HOSPITAL_COMMUNITY)
Admission: RE | Admit: 2024-07-20 | Discharge: 2024-07-20 | Disposition: A | Discharge status: Home / Self Care | Source: Ambulatory Visit | Attending: Gastroenterology | Admitting: Gastroenterology

## 2024-07-20 DIAGNOSIS — R1319 Other dysphagia: Secondary | ICD-10-CM

## 2024-07-20 DIAGNOSIS — R1310 Dysphagia, unspecified: Secondary | ICD-10-CM | POA: Diagnosis not present

## 2024-07-20 DIAGNOSIS — K224 Dyskinesia of esophagus: Secondary | ICD-10-CM | POA: Diagnosis not present

## 2024-07-22 ENCOUNTER — Ambulatory Visit (INDEPENDENT_AMBULATORY_CARE_PROVIDER_SITE_OTHER): Payer: Self-pay | Admitting: Gastroenterology

## 2024-07-27 ENCOUNTER — Encounter (INDEPENDENT_AMBULATORY_CARE_PROVIDER_SITE_OTHER): Payer: Self-pay | Admitting: Gastroenterology

## 2024-08-08 DIAGNOSIS — N952 Postmenopausal atrophic vaginitis: Secondary | ICD-10-CM | POA: Diagnosis not present

## 2024-08-08 DIAGNOSIS — D649 Anemia, unspecified: Secondary | ICD-10-CM | POA: Diagnosis not present

## 2024-08-08 DIAGNOSIS — N951 Menopausal and female climacteric states: Secondary | ICD-10-CM | POA: Diagnosis not present

## 2024-08-08 DIAGNOSIS — N200 Calculus of kidney: Secondary | ICD-10-CM | POA: Diagnosis not present

## 2024-08-08 DIAGNOSIS — R300 Dysuria: Secondary | ICD-10-CM | POA: Diagnosis not present

## 2024-08-08 DIAGNOSIS — E039 Hypothyroidism, unspecified: Secondary | ICD-10-CM | POA: Diagnosis not present

## 2024-08-08 DIAGNOSIS — R609 Edema, unspecified: Secondary | ICD-10-CM | POA: Diagnosis not present

## 2024-08-08 DIAGNOSIS — E7849 Other hyperlipidemia: Secondary | ICD-10-CM | POA: Diagnosis not present

## 2024-08-08 DIAGNOSIS — M545 Low back pain, unspecified: Secondary | ICD-10-CM | POA: Diagnosis not present

## 2024-08-19 DIAGNOSIS — T84028A Dislocation of other internal joint prosthesis, initial encounter: Secondary | ICD-10-CM | POA: Diagnosis not present

## 2024-08-19 DIAGNOSIS — Z96659 Presence of unspecified artificial knee joint: Secondary | ICD-10-CM | POA: Diagnosis not present

## 2024-08-22 ENCOUNTER — Other Ambulatory Visit: Payer: Self-pay

## 2024-08-22 ENCOUNTER — Encounter (HOSPITAL_COMMUNITY): Payer: Self-pay | Admitting: Orthopedic Surgery

## 2024-08-22 NOTE — Progress Notes (Signed)
 PCP - Comer Raymond, PA  Anesthesia review: N  Patient verbally denies any shortness of breath, fever, cough and chest pain during phone call   -------------  SDW INSTRUCTIONS given:  Your procedure is scheduled on Tues. Nov 11th.  Report to [facility] at 1245 P.M., and check in at the Admitting office.  Call this number if you have problems the morning of surgery:  [phone]   Remember:  Do not eat after midnight the night before your surgery  You may drink clear liquids until Noon the day of your surgery.   Clear liquids allowed are: Water , Non-Citrus Juices (without pulp), Carbonated Beverages, Clear Tea, Black Coffee Only, and Gatorade    Take these medicines the morning of surgery with A SIP OF WATER   buPROPion (WELLBUTRIN XL)  clonazePAM  (KLONOPIN )  diphenhydrAMINE  (BENADRYL )  escitalopram  (LEXAPRO )  acetaminophen  (TYLENOL )-if needed   As of today, STOP taking any Aspirin  (unless otherwise instructed by your surgeon), meloxicam (MOBIC),  diclofenac (VOLTAREN), Aleve, Naproxen, Ibuprofen , Motrin , Advil , Goody's, BC's, all herbal medications, fish oil, and all vitamins.                      Do not wear jewelry, make up, or nail polish            Do not wear lotions, powders, perfumes/colognes, or deodorant.            Do not shave 48 hours prior to surgery.  Men may shave face and neck.            Do not bring valuables to the hospital.            [facility] is not responsible for any belongings or valuables.  Do NOT Smoke (Tobacco/Vaping) 24 hours prior to your procedure If you use a CPAP at night, you may bring all equipment for your overnight stay.   Contacts, glasses, dentures or bridgework may not be worn into surgery.      For patients admitted to the hospital, discharge time will be determined by your treatment team.   Patients discharged the day of surgery will not be allowed to drive home, and someone needs to stay with them for 24  hours.    Special instructions:   [place]- Preparing For Surgery  Before surgery, you can play an important role. Because skin is not sterile, your skin needs to be as free of germs as possible. You can reduce the number of germs on your skin by washing with CHG (chlorahexidine gluconate) Soap before surgery.  CHG is an antiseptic cleaner which kills germs and bonds with the skin to continue killing germs even after washing.    Oral Hygiene is also important to reduce your risk of infection.  Remember - BRUSH YOUR TEETH THE MORNING OF SURGERY WITH YOUR REGULAR TOOTHPASTE  Please do not use if you have an allergy to CHG or antibacterial soaps. If your skin becomes reddened/irritated stop using the CHG.  Do not shave (including legs and underarms) for at least 48 hours prior to first CHG shower. It is OK to shave your face.  Please follow these instructions carefully.   Shower the NIGHT BEFORE SURGERY and the MORNING OF SURGERY with DIAL Soap.   Pat yourself dry with a CLEAN TOWEL.  Wear CLEAN PAJAMAS to bed the night before surgery  Place CLEAN SHEETS on your bed the night of your first shower and DO NOT SLEEP WITH PETS.  Day of Surgery: Please shower morning of surgery  Wear Clean/Comfortable clothing the morning of surgery Do not apply any deodorants/lotions.   Remember to brush your teeth WITH YOUR REGULAR TOOTHPASTE.   Questions were answered. Patient verbalized understanding of instructions.

## 2024-08-22 NOTE — Progress Notes (Signed)
 Pt aware of new arrival time 1400, ERAS [id number].

## 2024-08-23 ENCOUNTER — Inpatient Hospital Stay (HOSPITAL_COMMUNITY): Payer: Self-pay | Admitting: Anesthesiology

## 2024-08-23 ENCOUNTER — Inpatient Hospital Stay (HOSPITAL_COMMUNITY)
Admission: RE | Admit: 2024-08-23 | Discharge: 2024-08-25 | DRG: 468 | Disposition: A | Discharge status: Home / Self Care | Attending: Orthopedic Surgery | Admitting: Orthopedic Surgery

## 2024-08-23 ENCOUNTER — Other Ambulatory Visit: Payer: Self-pay

## 2024-08-23 ENCOUNTER — Encounter (HOSPITAL_COMMUNITY): Admission: RE | Disposition: A | Payer: Self-pay | Source: Home / Self Care | Attending: Orthopedic Surgery

## 2024-08-23 ENCOUNTER — Encounter (HOSPITAL_COMMUNITY): Payer: Self-pay | Admitting: Orthopedic Surgery

## 2024-08-23 ENCOUNTER — Inpatient Hospital Stay (HOSPITAL_COMMUNITY)

## 2024-08-23 DIAGNOSIS — G8918 Other acute postprocedural pain: Secondary | ICD-10-CM | POA: Diagnosis not present

## 2024-08-23 DIAGNOSIS — K219 Gastro-esophageal reflux disease without esophagitis: Secondary | ICD-10-CM | POA: Diagnosis present

## 2024-08-23 DIAGNOSIS — T84022A Instability of internal right knee prosthesis, initial encounter: Secondary | ICD-10-CM | POA: Diagnosis not present

## 2024-08-23 DIAGNOSIS — T84012A Broken internal right knee prosthesis, initial encounter: Secondary | ICD-10-CM | POA: Diagnosis not present

## 2024-08-23 DIAGNOSIS — Y792 Prosthetic and other implants, materials and accessory orthopedic devices associated with adverse incidents: Secondary | ICD-10-CM | POA: Diagnosis present

## 2024-08-23 DIAGNOSIS — Z90711 Acquired absence of uterus with remaining cervical stump: Secondary | ICD-10-CM

## 2024-08-23 DIAGNOSIS — F419 Anxiety disorder, unspecified: Secondary | ICD-10-CM | POA: Diagnosis present

## 2024-08-23 DIAGNOSIS — D509 Iron deficiency anemia, unspecified: Secondary | ICD-10-CM | POA: Diagnosis not present

## 2024-08-23 DIAGNOSIS — Z96651 Presence of right artificial knee joint: Secondary | ICD-10-CM | POA: Diagnosis not present

## 2024-08-23 DIAGNOSIS — Z79899 Other long term (current) drug therapy: Secondary | ICD-10-CM | POA: Diagnosis not present

## 2024-08-23 DIAGNOSIS — Z791 Long term (current) use of non-steroidal anti-inflammatories (NSAID): Secondary | ICD-10-CM | POA: Diagnosis not present

## 2024-08-23 DIAGNOSIS — T849XXA Unspecified complication of internal orthopedic prosthetic device, implant and graft, initial encounter: Secondary | ICD-10-CM | POA: Diagnosis not present

## 2024-08-23 DIAGNOSIS — T84092A Other mechanical complication of internal right knee prosthesis, initial encounter: Secondary | ICD-10-CM | POA: Diagnosis not present

## 2024-08-23 HISTORY — PX: CONVERSION TO TOTAL KNEE: SHX5785

## 2024-08-23 LAB — CBC
HCT: 41.1 % (ref 36.0–46.0)
Hemoglobin: 13.4 g/dL (ref 12.0–15.0)
MCH: 28.2 pg (ref 26.0–34.0)
MCHC: 32.6 g/dL (ref 30.0–36.0)
MCV: 86.3 fL (ref 80.0–100.0)
Platelets: 194 K/uL (ref 150–400)
RBC: 4.76 MIL/uL (ref 3.87–5.11)
RDW: 13.6 % (ref 11.5–15.5)
WBC: 6.3 K/uL (ref 4.0–10.5)
nRBC: 0 % (ref 0.0–0.2)

## 2024-08-23 LAB — SURGICAL PCR SCREEN

## 2024-08-23 SURGERY — CONVERSION, ARTHROPLASTY, KNEE, PARTIAL, TO TOTAL KNEE ARTHROPLASTY
Anesthesia: Monitor Anesthesia Care | Site: Knee | Laterality: Right

## 2024-08-23 MED ORDER — FENTANYL CITRATE (PF) 100 MCG/2ML IJ SOLN: 25.0000 ug | INTRAMUSCULAR | Status: DC | PRN

## 2024-08-23 MED ORDER — ONDANSETRON HCL 4 MG PO TABS
4.0000 mg | ORAL_TABLET | Freq: Four times a day (QID) | ORAL | Status: DC | PRN
  Administered 2024-08-24: 4 mg via ORAL
  Filled 2024-08-23: qty 1

## 2024-08-23 MED ORDER — OXYCODONE HCL 5 MG PO TABS
10.0000 mg | ORAL_TABLET | ORAL | Status: DC | PRN
  Administered 2024-08-24 (×3): 10 mg via ORAL
  Filled 2024-08-23 (×2): qty 2

## 2024-08-23 MED ORDER — TRANEXAMIC ACID-NACL 1000-0.7 MG/100ML-% IV SOLN
INTRAVENOUS | Status: AC
  Filled 2024-08-23: qty 100

## 2024-08-23 MED ORDER — HYDROMORPHONE HCL 1 MG/ML IJ SOLN
0.5000 mg | INTRAMUSCULAR | Status: DC | PRN
  Administered 2024-08-23 – 2024-08-24 (×2): 1 mg via INTRAVENOUS
  Filled 2024-08-23 (×2): qty 1

## 2024-08-23 MED ORDER — SODIUM CHLORIDE 0.9 % IR SOLN
Status: DC | PRN
  Administered 2024-08-23: 1

## 2024-08-23 MED ORDER — PHENOL 1.4 % MT LIQD: 1.0000 | OROMUCOSAL | Status: DC | PRN

## 2024-08-23 MED ORDER — DIPHENHYDRAMINE HCL 12.5 MG/5ML PO ELIX: 12.5000 mg | ORAL_SOLUTION | ORAL | Status: DC | PRN

## 2024-08-23 MED ORDER — CEFAZOLIN SODIUM-DEXTROSE 2-4 GM/100ML-% IV SOLN
2.0000 g | Freq: Four times a day (QID) | INTRAVENOUS | Status: AC
  Administered 2024-08-23 – 2024-08-24 (×2): 2 g via INTRAVENOUS
  Filled 2024-08-23 (×2): qty 100

## 2024-08-23 MED ORDER — BISACODYL 10 MG RE SUPP: 10.0000 mg | Freq: Every day | RECTAL | Status: DC | PRN

## 2024-08-23 MED ORDER — CHLORHEXIDINE GLUCONATE 0.12 % MT SOLN
OROMUCOSAL | Status: AC
  Administered 2024-08-23: 15 mL via OROMUCOSAL
  Filled 2024-08-23: qty 15

## 2024-08-23 MED ORDER — FENTANYL CITRATE (PF) 100 MCG/2ML IJ SOLN
INTRAMUSCULAR | Status: AC
  Filled 2024-08-23: qty 2

## 2024-08-23 MED ORDER — CEFAZOLIN SODIUM-DEXTROSE 2-4 GM/100ML-% IV SOLN
INTRAVENOUS | Status: AC
  Filled 2024-08-23: qty 100

## 2024-08-23 MED ORDER — CEFAZOLIN SODIUM-DEXTROSE 2-4 GM/100ML-% IV SOLN
2.0000 g | INTRAVENOUS | Status: AC
Start: 2024-08-23 — End: 2024-08-23
  Administered 2024-08-23: 2 g via INTRAVENOUS

## 2024-08-23 MED ORDER — OXYCODONE HCL 5 MG PO TABS: 5.0000 mg | ORAL_TABLET | Freq: Once | ORAL | Status: DC | PRN

## 2024-08-23 MED ORDER — ACETAMINOPHEN 500 MG PO TABS
ORAL_TABLET | ORAL | Status: AC
  Filled 2024-08-23: qty 2

## 2024-08-23 MED ORDER — ONDANSETRON HCL 4 MG/2ML IJ SOLN: 4.0000 mg | Freq: Once | INTRAMUSCULAR | Status: DC | PRN

## 2024-08-23 MED ORDER — CHLORHEXIDINE GLUCONATE 0.12 % MT SOLN: 15.0000 mL | Freq: Once | OROMUCOSAL | Status: AC

## 2024-08-23 MED ORDER — ACETAMINOPHEN 500 MG PO TABS: 1000.0000 mg | ORAL_TABLET | Freq: Once | ORAL | Status: DC

## 2024-08-23 MED ORDER — BUPROPION HCL ER (XL) 150 MG PO TB24
150.0000 mg | ORAL_TABLET | Freq: Every morning | ORAL | Status: DC
  Administered 2024-08-24 – 2024-08-25 (×2): 150 mg via ORAL
  Filled 2024-08-23 (×2): qty 1

## 2024-08-23 MED ORDER — TRANEXAMIC ACID-NACL 1000-0.7 MG/100ML-% IV SOLN
INTRAVENOUS | Status: AC
  Filled 2024-08-23: qty 200

## 2024-08-23 MED ORDER — ACETAMINOPHEN 500 MG PO TABS
1000.0000 mg | ORAL_TABLET | Freq: Four times a day (QID) | ORAL | Status: AC
  Administered 2024-08-23 – 2024-08-24 (×3): 1000 mg via ORAL
  Filled 2024-08-23 (×4): qty 2

## 2024-08-23 MED ORDER — METHOCARBAMOL 500 MG PO TABS
500.0000 mg | ORAL_TABLET | Freq: Four times a day (QID) | ORAL | Status: DC | PRN
  Administered 2024-08-24 (×2): 500 mg via ORAL
  Filled 2024-08-23 (×2): qty 1

## 2024-08-23 MED ORDER — METHOCARBAMOL 1000 MG/10ML IJ SOLN: 500.0000 mg | Freq: Four times a day (QID) | INTRAMUSCULAR | Status: DC | PRN

## 2024-08-23 MED ORDER — ALUM & MAG HYDROXIDE-SIMETH 200-200-20 MG/5ML PO SUSP: 30.0000 mL | ORAL | Status: DC | PRN

## 2024-08-23 MED ORDER — CELECOXIB 200 MG PO CAPS: 200.0000 mg | ORAL_CAPSULE | Freq: Once | ORAL | Status: AC

## 2024-08-23 MED ORDER — CELECOXIB 200 MG PO CAPS
ORAL_CAPSULE | ORAL | Status: AC
  Administered 2024-08-23: 200 mg via ORAL
  Filled 2024-08-23: qty 1

## 2024-08-23 MED ORDER — LACTATED RINGERS IV SOLN: INTRAVENOUS | Status: DC

## 2024-08-23 MED ORDER — POLYETHYLENE GLYCOL 3350 17 G PO PACK: 17.0000 g | PACK | Freq: Every day | ORAL | Status: DC | PRN

## 2024-08-23 MED ORDER — MEPERIDINE HCL 25 MG/ML IJ SOLN: 6.2500 mg | INTRAMUSCULAR | Status: DC | PRN

## 2024-08-23 MED ORDER — ONDANSETRON HCL 4 MG/2ML IJ SOLN
INTRAMUSCULAR | Status: DC | PRN
  Administered 2024-08-23: 4 mg via INTRAVENOUS

## 2024-08-23 MED ORDER — ASPIRIN 81 MG PO CHEW
81.0000 mg | CHEWABLE_TABLET | Freq: Two times a day (BID) | ORAL | Status: DC
  Administered 2024-08-24 – 2024-08-25 (×3): 81 mg via ORAL
  Filled 2024-08-23 (×3): qty 1

## 2024-08-23 MED ORDER — DOCUSATE SODIUM 100 MG PO CAPS
100.0000 mg | ORAL_CAPSULE | Freq: Two times a day (BID) | ORAL | Status: DC
  Administered 2024-08-23 – 2024-08-25 (×4): 100 mg via ORAL
  Filled 2024-08-23 (×4): qty 1

## 2024-08-23 MED ORDER — POVIDONE-IODINE 10 % EX SWAB: 2.0000 | Freq: Once | CUTANEOUS | Status: DC

## 2024-08-23 MED ORDER — BUPIVACAINE HCL (PF) 0.25 % IJ SOLN
INTRAMUSCULAR | Status: AC
  Filled 2024-08-23: qty 30

## 2024-08-23 MED ORDER — KETOROLAC TROMETHAMINE 30 MG/ML IJ SOLN
INTRAMUSCULAR | Status: AC
  Filled 2024-08-23: qty 1

## 2024-08-23 MED ORDER — TRANEXAMIC ACID-NACL 1000-0.7 MG/100ML-% IV SOLN
1000.0000 mg | Freq: Once | INTRAVENOUS | Status: AC
  Administered 2024-08-23: 1000 mg via INTRAVENOUS
  Filled 2024-08-23: qty 100

## 2024-08-23 MED ORDER — CLONAZEPAM 0.5 MG PO TABS
1.0000 mg | ORAL_TABLET | Freq: Two times a day (BID) | ORAL | Status: DC
  Administered 2024-08-23: 1 mg via ORAL
  Filled 2024-08-23 (×2): qty 2

## 2024-08-23 MED ORDER — DEXAMETHASONE SOD PHOSPHATE PF 10 MG/ML IJ SOLN
10.0000 mg | Freq: Once | INTRAMUSCULAR | Status: AC
  Administered 2024-08-24: 10 mg via INTRAVENOUS

## 2024-08-23 MED ORDER — POVIDONE-IODINE 7.5 % EX SOLN: Freq: Once | CUTANEOUS | Status: AC

## 2024-08-23 MED ORDER — PROPOFOL 500 MG/50ML IV EMUL
INTRAVENOUS | Status: DC | PRN
  Administered 2024-08-23 (×2): 100 ug/kg/min via INTRAVENOUS

## 2024-08-23 MED ORDER — MAGNESIUM CITRATE PO SOLN: 1.0000 | Freq: Once | ORAL | Status: DC | PRN

## 2024-08-23 MED ORDER — BUPIVACAINE-EPINEPHRINE (PF) 0.25% -1:200000 IJ SOLN
INTRAMUSCULAR | Status: AC
  Filled 2024-08-23: qty 30

## 2024-08-23 MED ORDER — OXYCODONE HCL 5 MG PO TABS
5.0000 mg | ORAL_TABLET | ORAL | Status: DC | PRN
  Administered 2024-08-25: 5 mg via ORAL
  Filled 2024-08-23: qty 1
  Filled 2024-08-23: qty 2

## 2024-08-23 MED ORDER — ONDANSETRON HCL 4 MG/2ML IJ SOLN: 4.0000 mg | Freq: Four times a day (QID) | INTRAMUSCULAR | Status: DC | PRN

## 2024-08-23 MED ORDER — ROPIVACAINE HCL 5 MG/ML IJ SOLN
INTRAMUSCULAR | Status: DC | PRN
  Administered 2024-08-23: 20 mL via PERINEURAL

## 2024-08-23 MED ORDER — MIDAZOLAM HCL 2 MG/2ML IJ SOLN
INTRAMUSCULAR | Status: AC
  Administered 2024-08-23: 2 mg via INTRAVENOUS
  Filled 2024-08-23: qty 2

## 2024-08-23 MED ORDER — ZOLPIDEM TARTRATE 5 MG PO TABS: 5.0000 mg | ORAL_TABLET | Freq: Every evening | ORAL | Status: DC | PRN

## 2024-08-23 MED ORDER — METOCLOPRAMIDE HCL 5 MG PO TABS: 5.0000 mg | ORAL_TABLET | Freq: Three times a day (TID) | ORAL | Status: DC | PRN

## 2024-08-23 MED ORDER — PHENYLEPHRINE HCL-NACL 20-0.9 MG/250ML-% IV SOLN
INTRAVENOUS | Status: DC | PRN
  Administered 2024-08-23: 20 ug/min via INTRAVENOUS

## 2024-08-23 MED ORDER — FENTANYL CITRATE (PF) 100 MCG/2ML IJ SOLN
INTRAMUSCULAR | Status: DC | PRN
  Administered 2024-08-23: 50 ug via INTRAVENOUS
  Administered 2024-08-23 (×2): 25 ug via INTRAVENOUS

## 2024-08-23 MED ORDER — OXYCODONE HCL 5 MG/5ML PO SOLN: 5.0000 mg | Freq: Once | ORAL | Status: DC | PRN

## 2024-08-23 MED ORDER — POTASSIUM CHLORIDE IN NACL 20-0.9 MEQ/L-% IV SOLN
INTRAVENOUS | Status: DC
  Filled 2024-08-23: qty 1000

## 2024-08-23 MED ORDER — ONDANSETRON HCL 4 MG/2ML IJ SOLN
INTRAMUSCULAR | Status: AC
Start: 2024-08-23 — End: 2024-08-23
  Filled 2024-08-23: qty 2

## 2024-08-23 MED ORDER — MIDAZOLAM HCL (PF) 2 MG/2ML IJ SOLN: 2.0000 mg | Freq: Once | INTRAMUSCULAR | Status: AC

## 2024-08-23 MED ORDER — PROPOFOL 10 MG/ML IV BOLUS
INTRAVENOUS | Status: AC
Start: 2024-08-23 — End: 2024-08-23
  Filled 2024-08-23: qty 20

## 2024-08-23 MED ORDER — TRANEXAMIC ACID-NACL 1000-0.7 MG/100ML-% IV SOLN
1000.0000 mg | INTRAVENOUS | Status: AC
  Administered 2024-08-23: 1000 mg via INTRAVENOUS

## 2024-08-23 MED ORDER — 0.9 % SODIUM CHLORIDE (POUR BTL) OPTIME
TOPICAL | Status: DC | PRN
Start: 2024-08-23 — End: 2024-08-23
  Administered 2024-08-23: 1000 mL

## 2024-08-23 MED ORDER — ORAL CARE MOUTH RINSE: 15.0000 mL | Freq: Once | OROMUCOSAL | Status: AC

## 2024-08-23 MED ORDER — BUPIVACAINE IN DEXTROSE 0.75-8.25 % IT SOLN
INTRATHECAL | Status: DC | PRN
  Administered 2024-08-23: 2 mL via INTRATHECAL

## 2024-08-23 MED ORDER — ESCITALOPRAM OXALATE 10 MG PO TABS
20.0000 mg | ORAL_TABLET | Freq: Every morning | ORAL | Status: DC
  Administered 2024-08-24 – 2024-08-25 (×2): 20 mg via ORAL
  Filled 2024-08-23 (×2): qty 2

## 2024-08-23 MED ORDER — LIDOCAINE 2% (20 MG/ML) 5 ML SYRINGE
INTRAMUSCULAR | Status: AC
Start: 2024-08-23 — End: 2024-08-23
  Filled 2024-08-23: qty 5

## 2024-08-23 MED ORDER — BUPIVACAINE HCL (PF) 0.25 % IJ SOLN
INTRAMUSCULAR | Status: DC | PRN
  Administered 2024-08-23: 20 mL

## 2024-08-23 MED ORDER — POVIDONE-IODINE 10 % EX SWAB
2.0000 | Freq: Once | CUTANEOUS | Status: AC
  Administered 2024-08-23: 2 via TOPICAL

## 2024-08-23 MED ORDER — METOCLOPRAMIDE HCL 5 MG/ML IJ SOLN: 5.0000 mg | Freq: Three times a day (TID) | INTRAMUSCULAR | Status: DC | PRN

## 2024-08-23 MED ORDER — GABAPENTIN 300 MG PO CAPS
300.0000 mg | ORAL_CAPSULE | Freq: Every day | ORAL | Status: DC
  Administered 2024-08-23 – 2024-08-24 (×2): 300 mg via ORAL
  Filled 2024-08-23 (×2): qty 1

## 2024-08-23 MED ORDER — ACETAMINOPHEN 325 MG PO TABS
325.0000 mg | ORAL_TABLET | Freq: Four times a day (QID) | ORAL | Status: DC | PRN
  Administered 2024-08-25 (×2): 650 mg via ORAL
  Filled 2024-08-23 (×2): qty 2

## 2024-08-23 MED ORDER — MENTHOL 3 MG MT LOZG: 1.0000 | LOZENGE | OROMUCOSAL | Status: DC | PRN

## 2024-08-23 SURGICAL SUPPLY — 54 items
BAG COUNTER SPONGE SURGICOUNT (BAG) ×1 IMPLANT
BLADE SAG 18X100X1.27 (BLADE) ×2 IMPLANT
BLADE SAW SAG 90X13X1.27 (BLADE) IMPLANT
BNDG COMPR ESMARK 6X3 LF (GAUZE/BANDAGES/DRESSINGS) ×1 IMPLANT
BNDG ELASTIC 6X10 VLCR STRL LF (GAUZE/BANDAGES/DRESSINGS) ×1 IMPLANT
BOWL SMART MIX CTS (DISPOSABLE) ×1 IMPLANT
CEMENT BONE R 1X40 (Cement) ×2 IMPLANT
CLSR STERI-STRIP ANTIMIC 1/2X4 (GAUZE/BANDAGES/DRESSINGS) ×1 IMPLANT
COMP PATELLAR 29 STD 8 THK (Orthopedic Implant) IMPLANT
COMPONENT FEM CMT PS STD 4 RT (Joint) IMPLANT
COVER SURGICAL LIGHT HANDLE (MISCELLANEOUS) ×1 IMPLANT
CUFF TRNQT CYL 34X4.125X (TOURNIQUET CUFF) ×1 IMPLANT
DRAPE EXTREMITY T 121X128X90 (DISPOSABLE) ×1 IMPLANT
DRAPE HALF SHEET 40X57 (DRAPES) ×1 IMPLANT
DRAPE U-SHAPE 47X51 STRL (DRAPES) ×1 IMPLANT
DRSG MEPILEX POST OP 4X12 (GAUZE/BANDAGES/DRESSINGS) IMPLANT
DRSG MEPILEX POST OP 4X8 (GAUZE/BANDAGES/DRESSINGS) ×1 IMPLANT
DURAPREP 26ML APPLICATOR (WOUND CARE) ×1 IMPLANT
ELECT CAUTERY BLADE 6.4 (BLADE) ×1 IMPLANT
ELECTRODE REM PT RTRN 9FT ADLT (ELECTROSURGICAL) ×1 IMPLANT
GAUZE PAD ABD 8X10 STRL (GAUZE/BANDAGES/DRESSINGS) IMPLANT
GLOVE BIO SURGEON STRL SZ7 (GLOVE) ×2 IMPLANT
GLOVE BIOGEL PI IND STRL 7.0 (GLOVE) ×1 IMPLANT
GLOVE ORTHO TXT STRL SZ7.5 (GLOVE) ×1 IMPLANT
GOWN STRL REUS W/ TWL LRG LVL3 (GOWN DISPOSABLE) ×1 IMPLANT
GOWN STRL REUS W/ TWL XL LVL3 (GOWN DISPOSABLE) ×1 IMPLANT
HOOD PEEL AWAY T7 (MISCELLANEOUS) ×1 IMPLANT
HOOD W/PEELAWAY (MISCELLANEOUS) ×1 IMPLANT
IMMOBILIZER KNEE 22 40 CIR (ORTHOPEDIC SUPPLIES) IMPLANT
IMMOBILIZER KNEE 22 UNIV (SOFTGOODS) ×1 IMPLANT
INSERT TIB AS PERS SZ CD 12 RT (Insert) IMPLANT
KIT BASIN OR (CUSTOM PROCEDURE TRAY) ×1 IMPLANT
KIT TURNOVER KIT B (KITS) ×1 IMPLANT
MANIFOLD NEPTUNE II (INSTRUMENTS) ×1 IMPLANT
NDL 18GX1X1/2 (RX/OR ONLY) (NEEDLE) ×1 IMPLANT
NDL HYPO 25GX1X1/2 BEV (NEEDLE) IMPLANT
NEEDLE 18GX1X1/2 (RX/OR ONLY) (NEEDLE) ×1 IMPLANT
NEEDLE HYPO 25GX1X1/2 BEV (NEEDLE) ×1 IMPLANT
PACK TOTAL JOINT (CUSTOM PROCEDURE TRAY) ×1 IMPLANT
PAD ARMBOARD POSITIONER FOAM (MISCELLANEOUS) ×2 IMPLANT
PIN DRILL HDLS TROCAR 75 4PK (PIN) IMPLANT
SCREW FEMALE HEX FIX 25X2.5 (ORTHOPEDIC DISPOSABLE SUPPLIES) IMPLANT
SET HNDPC FAN SPRY TIP SCT (DISPOSABLE) ×1 IMPLANT
SET PAD KNEE POSITIONER (MISCELLANEOUS) IMPLANT
SOLN 0.9% NACL POUR BTL 1000ML (IV SOLUTION) ×1 IMPLANT
STEM TIBIA 5 DEG SZ C R KNEE (Knees) IMPLANT
SUCTION TUBE FRAZIER 10FR DISP (SUCTIONS) ×1 IMPLANT
SUT VIC AB 0 CT1 27XBRD ANBCTR (SUTURE) ×1 IMPLANT
SUT VIC AB 1 CT1 27XBRD ANBCTR (SUTURE) IMPLANT
SUT VIC AB 2-0 CT1 TAPERPNT 27 (SUTURE) ×1 IMPLANT
SUT VIC AB 3-0 SH 8-18 (SUTURE) ×2 IMPLANT
SYR 30ML LL (SYRINGE) IMPLANT
SYR CONTROL 10ML LL (SYRINGE) ×1 IMPLANT
TOWEL GREEN STERILE (TOWEL DISPOSABLE) ×1 IMPLANT

## 2024-08-23 NOTE — Op Note (Signed)
 DATE OF SURGERY:  08/23/2024 TIME: 8:12 PM  PATIENT NAME:  Anne Edwards   AGE: 63 y.o.    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  OPERATIVE IMPLANTS: Implant Name: CEMENT BONE R 1X40 - [id number] Type: Cement Inv. Item: CEMENT BONE R 1X40 Serial No.:  Manufacturer: [employer](ORTH,TRAU,BIO,SG) Lot No.: [id number] LRB: Right No. Used: 2 Action: Implanted   Implant Name: COMPONENT FEM CMT PS STD 4 RT - [id number] Type: Joint Inv. Item: COMPONENT FEM CMT PS STD 4 RT Serial No.:  Manufacturer: [employer](ORTH,TRAU,BIO,SG) Lot No.: [id number] LRB: Right No. Used: 1 Action: Implanted   Implant Name: STEM TIBIA 5 DEG SZ C R KNEE - [id number] Type: Knees Inv. Item: STEM TIBIA 5 DEG SZ C R KNEE Serial No.:  Manufacturer: [employer](ORTH,TRAU,BIO,SG) Lot No.: [id number] LRB: Right No. Used: 1 Action: Implanted   Implant Name: INSERT TIB AS PERS SZ CD 12 RT - [id number] Type: Insert Inv. Item: INSERT TIB AS PERS SZ CD 12 RT Serial No.:  Manufacturer: [employer](ORTH,TRAU,BIO,SG) Lot No.: [id number] LRB: Right No. Used: 1 Action: Implanted   Implant Name: COMP PATELLAR 29 STD 8 THK - [id number] Type: Orthopedic Implant Inv. Item: COMP PATELLAR 29 STD 8 THK Serial No.:  Manufacturer: [employer](ORTH,TRAU,BIO,SG) Lot No.: [id number] LRB: Right No. Used: 1 Action: Implanted   PREOPERATIVE INDICATIONS:  Anne Edwards is a 63 y.o. year old female who had a right partial knee replacement performed about a year  and a half ago.  She was changing close, and felt a pop in her knee, and had a polyethylene dislocation.  Her original operation had a size 6 polyethylene, and I suspect was a little bit tight in extension based on my operative report, but given the dislocation I recommended conversion to a total knee replacement.  The risks, benefits, and alternatives were discussed at length including but not limited to the risks of infection, bleeding, nerve injury, stiffness, blood clots, the need for revision surgery, cardiopulmonary complications, among others, and they were willing to proceed.  OPERATIVE FINDINGS AND UNIQUE ASPECTS OF THE CASE: Her anatomy was exceedingly small.  The 29 patella was almost too big, her patella only measured 20.5 mm before the cut, and 14 mm after the cut.  Additionally, I shifted the cutting block on the femur with a size 4 to be posterior by 2 mm in order to reduce the flexion gap, and get adequate anterior cut.  The size 4 is the smallest femur that they make.  Even having said that, I barely took off very much on the anterior flange.  I was a little concerned that the patella was somewhat full, being more thick than her original patella by 1.5 mm, but it ultimately tracked very well, and I was reluctant to cut more of the patella.  ESTIMATED BLOOD LOSS: 150 mL with a tourniquet time of 1 hour and 50 minutes set at 200 mmHg  Operative specimens: Synovial joint fluid as well as fat pad tissue for Gram stain culture and sensitivity.  This did not appear to be an infection.  There was no purulence noted.  OPERATIVE DESCRIPTION:  The patient was brought to the operative room and placed in a supine position.  Anesthesia was administered.  IV antibiotics were given.  The lower extremity was prepped and draped in the usual sterile fashion.  Time out was performed.  The leg was elevated and exsanguinated and the tourniquet was inflated.  Anterior quadriceps tendon splitting approach  was performed.  The patella was everted.  The anterior horn of the medial and lateral meniscus was removed.   The patella was then measured, and cut with the saw.  The thickness before the cut was 20.5 and after the cut was 14.  A metal shield was used to protect the patella throughout the case.    The distal femur was opened with the drill and the intramedullary distal femoral cutting jig was utilized, once I pinned the jig in place I used osteotomes to remove the femoral component and had very minimal bone loss.  I then went back and completed the femoral cut on the medial side, and then exposed the tibia.    Then the extramedullary tibial cutting jig was utilized making the appropriate cut using the anterior tibial crest as a reference building in appropriate posterior slope.  Care was taken during the cut to protect the medial and collateral ligaments.  I referenced taking 10 mm off of the lateral side, and had a just barely skim cut on the medial side.  I remove the tibial baseplate with an osteotome.  This appeared well-fixed.  The remnant proximal tibia was removed along with the posterior horns of the menisci.  The PCL was not aggressively released.    The extensor gap was measured and found to have adequate resection, measuring to a size 10 comfortably.    The distal femoral sizing jig was applied, taking care to avoid notching.  I set the external rotation using Whitesides lines, ignoring the posterior medial defect.    Then the 4-in-1 cutting jig was applied and the anterior and posterior femur was cut, along with the chamfer cuts.  All posterior osteophytes were removed.  The flexion gap was then measured and was symmetric with the extension gap.  I completed the distal femoral preparation using the appropriate jig to prepare the box.  The proximal tibia sized and prepared accordingly with the reamer and the punch, and then all components were trialed with the poly insert.  It was  somewhat challenging to get the proximal tibia to fit, even with the smallest size, it was just barely fitting.  The knee was found to have excellent balance and full motion.    The above named components were then cemented into place and all excess cement was removed.  The real polyethylene implant was placed.  After the cement had cured I released the tourniquet and confirmed excellent hemostasis with no major posterior vessel injury.    The knee was easily taken through a range of motion and the patella tracked well and the knee irrigated copiously and the parapatellar tissue closed with Stratafix and vicryl, and subcutaneous tissue closed with vicryl, and monocryl with steri strips for the skin.  The wounds were injected with marcaine , and dressed with sterile gauze and the patient was awakened and returned to the PACU in stable and satisfactory condition.  There were no complications.  Total tourniquet time was 1 hour and 50 minutes minutes.

## 2024-08-23 NOTE — Anesthesia Procedure Notes (Signed)
 Spinal  Patient location during procedure: OR Start time: 08/23/2024 5:34 PM End time: 08/23/2024 5:39 PM Reason for block: surgical anesthesia Staffing Performed: anesthesiologist  Anesthesiologist: Epifanio Fallow, MD Performed by: Epifanio Fallow, MD Authorized by: Epifanio Fallow, MD   Preanesthetic Checklist Completed: patient identified, IV checked, risks and benefits discussed, surgical consent, monitors and equipment checked, pre-op evaluation and timeout performed Spinal Block Patient position: sitting Prep: DuraPrep Patient monitoring: cardiac monitor, continuous pulse ox and blood pressure Approach: left paramedian Location: L3-4 Injection technique: single-shot Needle Needle type: Quincke  Needle gauge: 22 G Needle length: 9 cm Assessment Sensory level: T8 Events: CSF return Additional Notes Functioning IV was confirmed and monitors were applied. Sterile prep and drape, including hand hygiene and sterile gloves were used. The patient was positioned and the spine was prepped. The skin was anesthetized with lidocaine .  Free flow of clear CSF was obtained prior to injecting local anesthetic into the CSF.  The spinal needle aspirated freely following injection.  The needle was carefully withdrawn.  The patient tolerated the procedure well.

## 2024-08-23 NOTE — Transfer of Care (Signed)
 Immediate Anesthesia Transfer of Care Note  Patient: Anne Edwards  Procedure(s) Performed: CONVERSION, ARTHROPLASTY, KNEE, PARTIAL, TO TOTAL KNEE ARTHROPLASTY (Right: Knee)  Patient Location: PACU  Anesthesia Type:MAC combined with regional for post-op pain  Level of Consciousness: awake, alert , and oriented  Airway & Oxygen Therapy: Patient Spontanous Breathing and Patient connected to face mask oxygen  Post-op Assessment: Report given to RN, Post -op Vital signs reviewed and stable, and Patient moving all extremities  Post vital signs: Reviewed and stable  Last Vitals:  Vitals Value Taken Time  BP 144/64 08/23/24 20:37  Temp    Pulse 77 08/23/24 20:41  Resp 11 08/23/24 20:41  SpO2 98 % 08/23/24 20:41  Vitals shown include unfiled device data.  Last Pain:  Vitals:   08/23/24 1500  TempSrc:   PainSc: 0-No pain         Complications: No notable events documented.

## 2024-08-23 NOTE — Interval H&P Note (Signed)
 History and Physical Interval Note:  08/23/2024 3:00 PM  Anne Edwards  has presented today for surgery, with the diagnosis of Complications due to internal orthopedic device, implant.  She dislocated her polyethylene approximately 1-1/2 years after her index operation.  She says she was just changing her close and twisted her knee and felt a pop.    The various methods of treatment have been discussed with the patient and family. After consideration of risks, benefits and other options for treatment, the patient has consented to  Procedure(s): CONVERSION, ARTHROPLASTY, KNEE, PARTIAL, TO TOTAL KNEE ARTHROPLASTY (Right) as a surgical intervention.  We discussed the possibility of simply upsizing the polyethylene, however the polyethylene was already a 6, and I have concerns that she may not be ligamentously stable, I have question about her ACL, and I believe that conversion to a total knee is going to be the most predictable positive outcome.  The patient's history has been reviewed, patient examined, no change in status, stable for surgery.  I have reviewed the patient's chart and labs.  Questions were answered to the patient's satisfaction.     Anne Edwards

## 2024-08-23 NOTE — Anesthesia Preprocedure Evaluation (Addendum)
 Anesthesia Evaluation  Patient identified by MRN, date of birth, ID band Patient awake    Reviewed: Allergy & Precautions, NPO status , Patient's Chart, lab work & pertinent test results  History of Anesthesia Complications (+) PROLONGED EMERGENCE and history of anesthetic complications  Airway Mallampati: I  TM Distance: >3 FB Neck ROM: Full   Comment: Previous grade I view with MAC 3 Dental  (+) Dental Advisory Given   Pulmonary neg pulmonary ROS   Pulmonary exam normal breath sounds clear to auscultation       Cardiovascular negative cardio ROS  Rhythm:Regular Rate:Normal     Neuro/Psych  PSYCHIATRIC DISORDERS Anxiety     negative neurological ROS     GI/Hepatic Neg liver ROS, hiatal hernia,GERD  ,,  Endo/Other  negative endocrine ROS    Renal/GU negative Renal ROS     Musculoskeletal  (+) Arthritis ,  Scoliosis    Abdominal  (+) + obese  Peds  Hematology  (+) Blood dyscrasia, anemia Lab Results      Component                Value               Date                      WBC                      5.5                 12/15/2023                HGB                      11.4 (L)            12/15/2023                HCT                      36.5                12/15/2023                MCV                      87.3                12/15/2023                PLT                      184                 12/15/2023              Anesthesia Other Findings   Reproductive/Obstetrics                              Anesthesia Physical Anesthesia Plan  ASA: 3  Anesthesia Plan: MAC and Spinal   Post-op Pain Management: Regional block* and Tylenol  PO (pre-op)*   Induction: Intravenous  PONV Risk Score and Plan: 2 and Ondansetron , Dexamethasone  and Treatment may vary due to age or medical condition  Airway Management Planned: Natural Airway and Simple Face Mask  Additional Equipment:    Intra-op Plan:  Post-operative Plan:   Informed Consent: I have reviewed the patients History and Physical, chart, labs and discussed the procedure including the risks, benefits and alternatives for the proposed anesthesia with the patient or authorized representative who has indicated his/her understanding and acceptance.     Dental advisory given  Plan Discussed with: CRNA and Anesthesiologist  Anesthesia Plan Comments: (Discussed potential risks of nerve blocks including, but not limited to, infection, bleeding, nerve damage, seizures, pneumothorax, respiratory depression, and potential failure of the block. Alternatives to nerve blocks discussed. All questions answered.  I have discussed risks of neuraxial anesthesia including but not limited to infection, bleeding, nerve injury, back pain, headache, seizures, and failure of block. Patient denies bleeding disorders and is not currently anticoagulated. Labs have been reviewed. Risks and benefits discussed. All patient's questions answered.   Discussed with patient risks of MAC including, but not limited to, minor pain or discomfort, hearing people in the room, and possible need for backup general anesthesia. Risks for general anesthesia also discussed including, but not limited to, sore throat, hoarse voice, chipped/damaged teeth, injury to vocal cords, nausea and vomiting, allergic reactions, lung infection, heart attack, stroke, and death. All questions answered. )         Anesthesia Quick Evaluation

## 2024-08-23 NOTE — Anesthesia Procedure Notes (Signed)
 Anesthesia Regional Block: Adductor canal block   Pre-Anesthetic Checklist: , timeout performed,  Correct Patient, Correct Site, Correct Laterality,  Correct Procedure, Correct Position, site marked,  Risks and benefits discussed,  Surgical consent,  Pre-op evaluation,  At surgeon's request and post-op pain management  Laterality: Right  Prep: chloraprep       Needles:  Injection technique: Single-shot  Needle Type: Echogenic Stimulator Needle     Needle Length: 9cm  Needle Gauge: 21     Additional Needles:   Procedures:,,,, ultrasound used (permanent image in chart),,    Narrative:  Start time: 08/23/2024 2:49 PM End time: 08/23/2024 2:53 PM Injection made incrementally with aspirations every 5 mL.  Performed by: Personally  Anesthesiologist: Peggye Delon Brunswick, MD  Additional Notes: Discussed risks and benefits of nerve block including, but not limited to, prolonged and/or permanent nerve injury involving sensory and/or motor function. Monitors were applied and a time-out was performed. The nerve and associated structures were visualized under ultrasound guidance. After negative aspiration, local anesthetic was slowly injected around the nerve. There was no evidence of high pressure during the procedure. There were no paresthesias. VSS remained stable and the patient tolerated the procedure well.

## 2024-08-23 NOTE — H&P (Signed)
 PREOPERATIVE H&P  Chief Complaint: right knee pain   HPI: Anne Edwards is a 63 y.o. female who presents for followup of right knee pain. She does a history of a partial knee replacement. Surgery was January 27, 2023. She reports increased anterior pain after a pop felt on November 2nd. She describes her pain as a constant throbbing sensation. She is taking tylenol  for pain. She is using a cane and walker since this occurred. She was doing well up until this happened. Now she can barely walk.  Past Medical History:  Diagnosis Date   Anxiety    Arthritis    Complication of anesthesia    Slow to wake up   History of hiatal hernia    IDA (iron deficiency anemia) 11/24/2018   Nausea and vomiting 11/26/2020   Pneumonia    Past Surgical History:  Procedure Laterality Date   ABDOMINAL HYSTERECTOMY     APPLICATION OF WOUND VAC Left 11/18/2023   Procedure: APPLICATION OF WOUND VAC;  Surgeon: Edna Toribio LABOR, MD;  Location: [facility];  Service: Orthopedics;  Laterality: Left;   BIOPSY  09/24/2022   Procedure: BIOPSY;  Surgeon: Eartha Flavors, Toribio, MD;  [facility]: [facility];  Service: Gastroenterology;;   CHOLECYSTECTOMY     COLONOSCOPY  02/04/2018   Dr. Maranda, 3 polyps between 5-90mm and 3 cm from anal verge, repeat colonoscopy in 5 years   COLONOSCOPY WITH PROPOFOL  N/A 09/24/2022   Procedure: COLONOSCOPY WITH PROPOFOL ;  Surgeon: Eartha Flavors Toribio, MD;  [facility];  Service: Gastroenterology;  Laterality: N/A;  915 ASA 2   COLONOSCOPY WITH PROPOFOL  N/A 10/13/2023   Procedure: COLONOSCOPY WITH PROPOFOL ;  Surgeon: Eartha Flavors Toribio, MD;  [facility];  Service: Gastroenterology;  Laterality: N/A;  9:45am;asa 2   ESOPHAGEAL MANOMETRY N/A 01/02/2021   Procedure: ESOPHAGEAL MANOMETRY (EM);  Surgeon: Shila Gustav GAILS, MD;  [facility]: [facility];  Service: Endoscopy;  Laterality: N/A;   ESOPHAGOGASTRODUODENOSCOPY (EGD) WITH PROPOFOL  N/A 11/28/2020    Castaneda:6cm hh with a few cameron erosions, normal stomach, normal examined duodenum, no specimens collected.   ESOPHAGOGASTRODUODENOSCOPY (EGD) WITH PROPOFOL  N/A 09/24/2022   Procedure: ESOPHAGOGASTRODUODENOSCOPY (EGD) WITH PROPOFOL ;  Surgeon: Eartha Flavors Toribio, MD;  [facility];  Service: Gastroenterology;  Laterality: N/A;   EXTRACORPOREAL SHOCK WAVE LITHOTRIPSY Left 11/14/2022   Procedure: EXTRACORPOREAL SHOCK WAVE LITHOTRIPSY (ESWL);  Surgeon: Gaston Hamilton, MD;  Location: [facility];  Service: Urology;  Laterality: Left;   GIVENS CAPSULE STUDY N/A 12/01/2018   Procedure: GIVENS CAPSULE STUDY;  Surgeon: Golda Claudis PENNER, MD;  [facility];  Service: Endoscopy;  Laterality: N/A;  7:30   HEMOSTASIS CLIP PLACEMENT  09/24/2022   Procedure: HEMOSTASIS CLIP PLACEMENT;  Surgeon: Eartha Flavors Toribio, MD;  [facility];  Service: Gastroenterology;;   JOINT REPLACEMENT Left 07/2020   total L hip   PARTIAL HYSTERECTOMY     POLYPECTOMY  09/24/2022   Procedure: POLYPECTOMY INTESTINAL;  Surgeon: Eartha Flavors Toribio, MD;  [facility];  Service: Gastroenterology;;   POLYPECTOMY  10/13/2023   Procedure: POLYPECTOMY;  Surgeon: Eartha Flavors Toribio, MD;  [facility];  Service: Gastroenterology;;   HARLEY DILATION  09/24/2022   Procedure: HARLEY HODGKIN;  Surgeon: Eartha Flavors Toribio, MD;  [facility];  Service: Gastroenterology;;   ROBLEY MEYER INJECTION  10/13/2023   Procedure: SUBMUCOSAL LIFTING INJECTION;  Surgeon: Eartha Flavors Toribio, MD;  [facility];  Service:  Gastroenterology;;   TOTAL HIP REVISION Left 11/18/2023   Procedure: TOTAL HIP REVISION;  Surgeon: Edna Toribio LABOR, MD;  Location: [facility];  Service: Orthopedics;  Laterality: Left;   XI ROBOTIC ASSISTED HIATAL HERNIA REPAIR N/A 02/11/2021   Procedure: XI ROBOTIC ASSISTED HIATAL HERNIA REPAIR  WITH NISSAN FUNDOPLICATION;  Surgeon: Lyndel Deward PARAS, MD;  Location: [facility];  Service: General;  Laterality: N/A;   Social History   Socioeconomic History   Marital status: Married    Spouse name: Not on file   Number of children: Not on file   Years of education: Not on file   Highest education level: Not on file  Occupational History   Not on file  Tobacco Use   Smoking status: Never   Smokeless tobacco: Never  Vaping Use   Vaping status: Never Used  Substance and Sexual Activity   Alcohol  use: Never   Drug use: Never   Sexual activity: Not Currently  Other Topics Concern   Not on file  Social History Narrative   Not on file   Social Drivers of Health   Financial Resource Strain: Low Risk (03/02/2018)   Received from [facility]   Overall Financial Resource Strain (CARDIA)    Difficulty of Paying Living Expenses: Not hard at all  Food Insecurity: No Food Insecurity (11/19/2023)   Hunger Vital Sign    Worried About Running Out of Food in the Last Year: Never true    Ran Out of Food in the Last Year: Never true  Transportation Needs: No Transportation Needs (11/19/2023)   PRAPARE - Administrator, Civil Service (Medical): No    Lack of Transportation (Non-Medical): No  Physical Activity: Sufficiently Active (03/02/2018)   Received from [facility]   Exercise Vital Sign    Days of Exercise per Week: 4 days    Minutes of Exercise per Session: 60 min  Stress: No Stress Concern Present (03/02/2018)   Received from [facility] - Occupational Stress Questionnaire    Feeling of Stress : Not at all  Social Connections: Moderately Isolated (03/02/2018)   Received from [facility]   Social Connection and Isolation Panel    Frequency of Communication with Friends and Family: Once a week    Frequency of Social Gatherings with Friends and Family: Once a week    Attends Religious Services: Never    Automotive Engineer or Organizations: No    Attends Banker Meetings: Never    Marital Status: Married   History reviewed. No pertinent family history. No Known Allergies Prior to Admission medications   Medication Sig Start Date End Date Taking? Authorizing Provider  acetaminophen  (TYLENOL ) 500 MG tablet Take 500-1,000 mg by mouth every 6 (six) hours as needed (pain.).   Yes [provider]  buPROPion (WELLBUTRIN XL) 150 MG 24 hr tablet Take 150 mg by mouth in the morning.   Yes [provider]  cholecalciferol (VITAMIN D3) 25 MCG (1000 UNIT) tablet Take 1,000 Units by mouth in the morning.   Yes [provider]  clonazePAM  (KLONOPIN ) 1 MG tablet Take 1 mg by mouth in the morning and at bedtime.   Yes [provider]  diclofenac (VOLTAREN) 50 MG EC tablet Take 50 mg by mouth at bedtime.   Yes [provider]  diphenhydrAMINE  (BENADRYL ) 25 MG tablet Take 25 mg by mouth in the morning.   Yes  [provider]  escitalopram  (LEXAPRO ) 20 MG tablet Take 20 mg by mouth in the morning.   Yes [provider]  gabapentin  (NEURONTIN ) 300 MG capsule Take 300 mg by mouth at bedtime.   Yes [provider]  meloxicam (MOBIC) 15 MG tablet Take 15 mg by mouth in the morning.   Yes [provider]     Positive ROS: All other systems have been reviewed and were otherwise negative with the exception of those mentioned in the HPI and as above.  Physical Exam: General: Alert, no acute distress Cardiovascular: No pedal edema Respiratory: No cyanosis, no use of accessory musculature GI: No organomegaly, abdomen is soft and non-tender Skin: No lesions in the area of chief complaint Neurologic: Sensation intact distally Psychiatric: Patient is competent for consent with normal mood and affect Lymphatic: No axillary or cervical lymphadenopathy  MUSCULOSKELETAL: Right knee has well-healed surgical wounds, she keeps it in a  relatively flexed position at about 20 degrees. I did not aggressively test motion. No redness or signs for infection.  X-rays: 4 views of the right knee ordered and interpreted by me demonstrate dislocation of the unicompartmental polyethylene.  Assessment: Right unicompartmental knee replacement with dislocation of polyethylene   Plan: This is an acute severe problem, she already has a size 6 polyethylene insert, I am concerned that upsizing the polyethylene will not lead to long-term functional outcome. She is going to need urgent surgery to reconstruct her knee and we will plan for conversion to a total knee replacement. This carries risks for long-term function, as well as increased morbidity given the nature of revision surgery.  The risks benefits and alternatives and postoperative recovery course have been outlined in detail, all questions been answered and operation demonstrated models and handouts provided. Plan to proceed accordingly.  Plan for Procedure(s): CONVERSION, ARTHROPLASTY, KNEE, PARTIAL, TO TOTAL KNEE ARTHROPLASTY  The risks benefits and alternatives were discussed with the patient including but not limited to the risks of nonoperative treatment, versus surgical intervention including infection, bleeding, nerve injury,  blood clots, cardiopulmonary complications, morbidity, mortality, among others, and they were willing to proceed.   Army MARLA Daring, PA-C    08/23/2024 12:42 PM

## 2024-08-23 NOTE — Discharge Instructions (Signed)

## 2024-08-24 ENCOUNTER — Encounter (HOSPITAL_COMMUNITY): Payer: Self-pay | Admitting: Orthopedic Surgery

## 2024-08-24 LAB — BASIC METABOLIC PANEL WITH GFR
Anion gap: 10 (ref 5–15)
BUN: 11 mg/dL (ref 8–23)
CO2: 25 mmol/L (ref 22–32)
Calcium: 8 mg/dL — ABNORMAL LOW (ref 8.9–10.3)
Chloride: 107 mmol/L (ref 98–111)
Creatinine, Ser: 0.74 mg/dL (ref 0.44–1.00)
GFR, Estimated: >60 mL/min (ref >60)
Glucose, Bld: 108 mg/dL — ABNORMAL HIGH (ref 70–99)
Potassium: 4.2 mmol/L (ref 3.5–5.1)
Sodium: 142 mmol/L (ref 135–145)

## 2024-08-24 LAB — CBC
HCT: 37.3 % (ref 36.0–46.0)
Hemoglobin: 12 g/dL (ref 12.0–15.0)
MCH: 28.1 pg (ref 26.0–34.0)
MCHC: 32.2 g/dL (ref 30.0–36.0)
MCV: 87.4 fL (ref 80.0–100.0)
Platelets: 153 K/uL (ref 150–400)
RBC: 4.27 MIL/uL (ref 3.87–5.11)
RDW: 13.7 % (ref 11.5–15.5)
WBC: 7.6 K/uL (ref 4.0–10.5)
nRBC: 0 % (ref 0.0–0.2)

## 2024-08-24 MED ORDER — CLONAZEPAM 0.5 MG PO TABS: 1.0000 mg | ORAL_TABLET | Freq: Two times a day (BID) | ORAL | Status: DC | PRN

## 2024-08-24 MED ORDER — METHOCARBAMOL 1000 MG/10ML IJ SOLN: 500.0000 mg | Freq: Two times a day (BID) | INTRAMUSCULAR | Status: DC | PRN

## 2024-08-24 MED ORDER — METHOCARBAMOL 500 MG PO TABS
500.0000 mg | ORAL_TABLET | Freq: Two times a day (BID) | ORAL | Status: DC | PRN
  Administered 2024-08-24 – 2024-08-25 (×2): 500 mg via ORAL
  Filled 2024-08-24 (×2): qty 1

## 2024-08-24 NOTE — Progress Notes (Addendum)
 Subjective: 1 Day Post-Op s/p Procedure(s): CONVERSION, ARTHROPLASTY, KNEE, PARTIAL, TO TOTAL KNEE ARTHROPLASTY   Patient laying in bed, somnolent on exam. Easily awoken, but quickly falls back asleep. Severe pain last night, oxycodone  given around 1 am and dilaudid  given around 4 am, patient's husband says she has been fast asleep since then. When awoken, denies chest pain, SOB, Calf pain. No nausea/vomiting.  No other complaints.    Objective:  PE: VITALS:   Vitals:   08/24/24 0031 08/24/24 0404 08/24/24 0715 08/24/24 0735  BP: 112/61 (!) 110/56 (!) 144/76 115/73  Pulse: 74 77 99 98  Resp: 16 18    Temp: 97.7 F (36.5 C) 97.9 F (36.6 C) 98.4 F (36.9 C)   TempSrc: Oral     SpO2: 96% 97% 90% 95%  Weight:      Height:       Laying in bed, somnolent but easily awoken then quickly falls back asleep.  Sensation intact distally Intact pulses distally Dorsiflexion/Plantar flexion intact Incision: dressing C/D/I  LABS  Results for orders placed or performed during the hospital encounter of 08/23/24 (from the past 24 hours)  CBC per protocol     Status: None   Collection Time: 08/23/24  1:55 PM  Result Value Ref Range   WBC 6.3 4.0 - 10.5 K/uL   RBC 4.76 3.87 - 5.11 MIL/uL   Hemoglobin 13.4 12.0 - 15.0 g/dL   HCT 58.8 63.9 - 53.9 %   MCV 86.3 80.0 - 100.0 fL   MCH 28.2 26.0 - 34.0 pg   MCHC 32.6 30.0 - 36.0 g/dL   RDW 86.3 88.4 - 84.4 %   Platelets 194 150 - 400 K/uL   nRBC 0.0 0.0 - 0.2 %  Surgical pcr screen     Status: Abnormal   Collection Time: 08/23/24  1:57 PM   Specimen: Nasal Mucosa; Nasal Swab  Result Value Ref Range   MRSA, PCR (A) NEGATIVE    INVALID, UNABLE TO DETERMINE THE PRESENCE OF TARGET DUE TO SPECIMEN INTEGRITY. RECOLLECTION REQUESTED.   Staphylococcus aureus (A) NEGATIVE    INVALID, UNABLE TO DETERMINE THE PRESENCE OF TARGET DUE TO SPECIMEN INTEGRITY. RECOLLECTION REQUESTED.  Aerobic/Anaerobic Culture w Gram Stain (surgical/deep wound)      Status: None (Preliminary result)   Collection Time: 08/23/24  6:11 PM   Specimen: PATH Soft tissue  Result Value Ref Range   Specimen Description FLUID SYNOVIAL    Special Requests PT ON ANCEF     Gram Stain      NO WBC SEEN NO ORGANISMS SEEN Performed at [facility], [address], [place], [place] [address]    Culture PENDING    Report Status PENDING   Aerobic/Anaerobic Culture w Gram Stain (surgical/deep wound)     Status: None (Preliminary result)   Collection Time: 08/23/24  6:44 PM   Specimen: PATH Soft tissue  Result Value Ref Range   Specimen Description TISSUE SYNOVIAL RIGHT KNEE    Special Requests PT ON ANCEF     Gram Stain      NO WBC SEEN NO ORGANISMS SEEN Performed at [facility], [address] [place], [place] [address]    Culture PENDING    Report Status PENDING   CBC     Status: None   Collection Time: 08/24/24  4:06 AM  Result Value Ref Range   WBC 7.6 4.0 - 10.5 K/uL   RBC 4.27 3.87 - 5.11 MIL/uL   Hemoglobin  12.0 12.0 - 15.0 g/dL   HCT 62.6 63.9 - 53.9 %   MCV 87.4 80.0 - 100.0 fL   MCH 28.1 26.0 - 34.0 pg   MCHC 32.2 30.0 - 36.0 g/dL   RDW 86.2 88.4 - 84.4 %   Platelets 153 150 - 400 K/uL   nRBC 0.0 0.0 - 0.2 %  Basic metabolic panel     Status: Abnormal   Collection Time: 08/24/24  4:06 AM  Result Value Ref Range   Sodium 142 135 - 145 mmol/L   Potassium 4.2 3.5 - 5.1 mmol/L   Chloride 107 98 - 111 mmol/L   CO2 25 22 - 32 mmol/L   Glucose, Bld 108 (H) 70 - 99 mg/dL   BUN 11 8 - 23 mg/dL   Creatinine, Ser 9.25 0.44 - 1.00 mg/dL   Calcium 8.0 (L) 8.9 - 10.3 mg/dL   GFR, Estimated >39 >39 mL/min   Anion gap 10 5 - 15    DG Knee Right Port Result Date: 08/23/2024 EXAM: 2 VIEW(S) XRAY OF THE RIGHT KNEE 08/23/2024 09:12:00 PM COMPARISON: None available. CLINICAL HISTORY: S/P revision of total knee, right FINDINGS: BONES AND JOINTS: Right knee arthroplasty in satisfactory position. No acute fracture. No joint dislocation. No  significant joint effusion. SOFT TISSUES: Associated soft tissue gas. IMPRESSION: 1. Right knee arthroplasty in satisfactory position. Electronically signed by: Pinkie Pebbles MD 08/23/2024 09:15 PM EST RP Workstation: [id number]    Today's  total administered Morphine Milligram Equivalents: 35 Yesterday's total administered Morphine Milligram Equivalents: 50  Assessment/Plan: Principal Problem:   S/P revision of total knee, right  1 Day Post-Op s/p Procedure(s): CONVERSION, ARTHROPLASTY, KNEE, PARTIAL, TO TOTAL KNEE ARTHROPLASTY - discussed surgery with patient and husband in room today -post op imaging stable  Weightbearing: WBAT RLE Insicional and dressing care: Reinforce dressings as needed VTE prophylaxis: Aspirin  81mg  BID x 30 days Pain control: She is on klonopin  BID and gabapentin  nightly at baseline. I have changed her Klonopin  to BID PRN and discontinued the dilaudid  to hopeful decrease somnolence today.  Follow - up plan: 2 weeks with Dr. Josefina Dispo: pending PT/OT eval, hopeful to discharge home this afternoon but may need another night  Addendum 4:02 PM: Spoke with patient and husband, Patient did not pass PT today, plan to keep overnight tonight for more progress with PT tomorrow.    Contact information:   Army Daring, DEVONNA Tzzxijbd 8-5  After hours and holidays please check [email or web address] for group call information for [facility]  Army MARLA Daring 08/24/2024, 9:21 AM

## 2024-08-24 NOTE — Progress Notes (Signed)
 Physical Therapy Treatment Patient Details Name: Anne Edwards MRN: [id number] DOB: 12/04/1960 Today's Date: 08/24/2024   History of Present Illness Anne Edwards is a 63 y.o. 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 Pt s/p revision right knee arthroplasty converting from unicompartmental knee replacement to a total knee replacement with revision of both the tibia, polyethylene, and femur. PMHx: anxiety, OA, GERD, hiatal hernia, and IDA.   PT Comments  Pt greeted supine in bed, pleasant and agreeable to PT session. She demonstrated improved level of arousal. Pt ambulated a household distance using RW with CGA. She demonstrated a step through antalgic gait pattern. Educated pt on R TKA HEP. Reviewed handout and demonstrated each exercise. Pt completed LAQs and ankle pumps while seated in recliner chair. Encouraged frequent mobilization with staff assistance and OOB<>chair 3x/day for all meals. Patient needs to practice stairs next session. Will continue to follow acutely and advance appropriately.    If plan is discharge home, recommend the following: A little help with walking and/or transfers;A little help with bathing/dressing/bathroom;Assistance with cooking/housework;Assist for transportation;Help with stairs or ramp for entrance   Can travel by private vehicle        Equipment Recommendations  None recommended by PT (Pt already has DME)    Recommendations for Other Services       Precautions / Restrictions Precautions Precautions: Knee;Fall Precaution Booklet Issued: Yes (comment) Recall of Precautions/Restrictions: Intact Restrictions Weight Bearing Restrictions Per Provider Order: Yes RLE Weight Bearing Per Provider Order: Weight bearing as tolerated Other Position/Activity Restrictions: Pt will NOT have pillow placed under the bend of the operative total knee.     Mobility  Bed Mobility Overal bed  mobility: Needs Assistance Bed Mobility: Supine to Sit     Supine to sit: HOB elevated, Used rails, Contact guard Sit to supine: Min assist   General bed mobility comments: Pt sat up on R side of bed with increased time. She was able to complete a SLR and complete hip abd on RLE to bring it to EOB. Pt relied on bed rail and side of bed to pull herself in order to scoot to EOB til feet flat. CGA for safety/support.    Transfers Overall transfer level: Needs assistance Equipment used: Rolling walker (2 wheels) Transfers: Sit to/from Stand, Bed to chair/wheelchair/BSC Sit to Stand: Contact guard assist   Step pivot transfers: Contact guard assist       General transfer comment: Cued proper hand placement using RW. Pt powered up with light assist. Good eccentric control. She transferred to recliner chair and in/out of bathroom.    Ambulation/Gait Ambulation/Gait assistance: Contact guard assist Gait Distance (Feet): 80 Feet Assistive device: Rolling walker (2 wheels) Gait Pattern/deviations: Step-through pattern, Decreased stride length, Decreased weight shift to right, Decreased stance time - right, Antalgic Gait velocity: decreased Gait velocity interpretation: <1.8 ft/sec, indicate of risk for recurrent falls   General Gait Details: Pt ambulated with slow steady steps. She self-limited weight acceptance on RLE d/t pain. Cues for proximity to RW. Pt demonstrated adquate foot clearance and good safety awareness. She navigated room, hallway, and bathroom. No LOB.   Stairs             Wheelchair Mobility     Tilt Bed    Modified Rankin (Stroke Patients Only)       Balance Overall balance assessment: Needs assistance Sitting-balance support: No upper extremity supported, Feet supported Sitting balance-Anne Scale: Good Sitting  balance - Comments: Pt completed pericare on toilet independently.   Standing balance support: Bilateral upper extremity supported, During  functional activity, Single extremity supported, Reliant on assistive device for balance Standing balance-Anne Scale: Poor Standing balance comment: Pt dependent on RW. She washed hands at sink independently.                            Communication Communication Communication: No apparent difficulties  Cognition Arousal: Lethargic, Alert Behavior During Therapy: WFL for tasks assessed/performed   PT - Cognitive impairments: No apparent impairments Difficult to assess due to: Level of arousal                     PT - Cognition Comments: Pt asleep upon entry, arose to verbal command and stayed awake and attentive. She doesn't recall morning session. Pt A,Ox4. Following commands: Intact Following commands impaired: Follows one step commands with increased time    Cueing Cueing Techniques: Verbal cues, Gestural cues, Tactile cues  Exercises Total Joint Exercises Ankle Circles/Pumps: Seated, Both, AROM, 10 reps Long Arc Quad: Seated, Right, AROM, 10 reps Goniometric ROM: R Knee Flexion AROM: 45deg. Other Exercises Other Exercises: Educated pt/husband on R TKA HEP. Provided handout. Reviewed and demonstrated each exercise with husband verbalizing understanding.    General Comments General comments (skin integrity, edema, etc.): VSS on RA. Reviewed R TKA HEP. Instructed pt to try and complete 1 set prior to going to bed tonight. Pt sat in recliner chair, encouraged her to sit up for 1-2 hours.      Pertinent Vitals/Pain Pain Assessment Pain Assessment: 0-10 Pain Score: 5  Faces Pain Scale: Hurts little more Pain Location: R knee and Head Pain Descriptors / Indicators: Operative site guarding, Grimacing, Discomfort, Aching, Headache Pain Intervention(s): Monitored during session, Limited activity within patient's tolerance, Repositioned    Home Living                          Prior Function            PT Goals (current goals can now be found in  the care plan section) Acute Rehab PT Goals Patient Stated Goal: Return Home and regain independence PT Goal Formulation: With patient/family Time For Goal Achievement: 09/07/24 Potential to Achieve Goals: Good Progress towards PT goals: Progressing toward goals    Frequency    7X/week      PT Plan      Co-evaluation              AM-PAC PT 6 Clicks Mobility   Outcome Measure  Help needed turning from your back to your side while in a flat bed without using bedrails?: A Little Help needed moving from lying on your back to sitting on the side of a flat bed without using bedrails?: A Little Help needed moving to and from a bed to a chair (including a wheelchair)?: A Little Help needed standing up from a chair using your arms (e.g., wheelchair or bedside chair)?: A Little Help needed to walk in hospital room?: A Little Help needed climbing 3-5 steps with a railing? : A Lot 6 Click Score: 17    End of Session Equipment Utilized During Treatment: Gait belt Activity Tolerance: Patient tolerated treatment well Patient left: in chair;with call bell/phone within reach;with chair alarm set;with family/visitor present Nurse Communication: Mobility status;Patient requests pain meds PT Visit Diagnosis: Other abnormalities of gait  and mobility (R26.89);Unsteadiness on feet (R26.81);Difficulty in walking, not elsewhere classified (R26.2);Pain Pain - Right/Left: Right Pain - part of body: Knee     Time: 8377-8341 PT Time Calculation (min) (ACUTE ONLY): 36 min  Charges:    $Gait Training: 8-22 mins $Therapeutic Activity: 8-22 mins PT General Charges $$ ACUTE PT VISIT: 1 Visit                     Randall SAUNDERS, PT, DPT Acute Rehabilitation Services Office: [phone] Secure Chat Preferred  Delon CHRISTELLA Callander 08/24/2024, 5:16 PM

## 2024-08-24 NOTE — Evaluation (Signed)
 Physical Therapy Evaluation Patient Details Name: Anne Edwards MRN: [id number] DOB: May 18, 1961 Today's Date: 08/24/2024  History of Present Illness  Anne Edwards is a 63 y.o. 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 Pt s/p revision right knee arthroplasty converting from unicompartmental knee replacement to a total knee replacement with revision of both the tibia, polyethylene, and femur. PMHx: anxiety, OA, GERD, hiatal hernia, and IDA.   Clinical Impression  Pt admitted with above diagnosis. PTA, pt was modI for functional mobility using SPC and modI for ADLs. She lives with her husband in a one story house with 3 STE. Pt currently with functional limitations due to the deficits listed below (see PT Problem List). She required min-modA for bed mobility, minA for sit<>stand using RW, and minA to take a few lateral side steps along EOB using RW. Pt current limited by lethargy, somnolence, pain, impaired balance, and decreased activity tolerance. Pt will benefit from acute skilled PT to increase her independence and safety with mobility to allow discharge. Will progress ambulation and practice stairs in future sessions.       If plan is discharge home, recommend the following: A little help with walking and/or transfers;A little help with bathing/dressing/bathroom;Assistance with cooking/housework;Assist for transportation;Help with stairs or ramp for entrance   Can travel by private vehicle        Equipment Recommendations None recommended by PT (Pt already has DME)  Recommendations for Other Services       Functional Status Assessment Patient has had a recent decline in their functional status and demonstrates the ability to make significant improvements in function in a reasonable and predictable amount of time.     Precautions / Restrictions Precautions Precautions: Knee;Fall Precaution Booklet Issued: Yes  (comment) Recall of Precautions/Restrictions: Impaired Precaution/Restrictions Comments: Educated pt/husband on knee precautions. Husband verbalized understanding. Pt lethargic throughout session, so limited recall and/or ability to comprehend instructions. Restrictions Weight Bearing Restrictions Per Provider Order: Yes RLE Weight Bearing Per Provider Order: Weight bearing as tolerated Other Position/Activity Restrictions: Pt will NOT have pillow placed under the bend of the operative total knee.      Mobility  Bed Mobility Overal bed mobility: Needs Assistance Bed Mobility: Supine to Sit, Sit to Supine     Supine to sit: Mod assist, HOB elevated, Used rails Sit to supine: Min assist   General bed mobility comments: Pt sat up on R side of bed with increased time. Cues for sequencing. Assist to manage RLE. Pt slowly brought BLE towards EOB. Use of bed pad to pivot hips to EOB and elevate trunk. Returning to bed pt controlled trunk down, assist to bring BLE into bed.    Transfers Overall transfer level: Needs assistance Equipment used: Rolling walker (2 wheels) Transfers: Sit to/from Stand Sit to Stand: Min assist           General transfer comment: Introduced RW and educated pt on proper and safe use of AD. Cued proper hand/foot placement and sequencing. She powered up with minA. Good eccentric control.    Ambulation/Gait Ambulation/Gait assistance: Min assist Gait Distance (Feet): 3 Feet Assistive device: Rolling walker (2 wheels) Gait Pattern/deviations: Step-to pattern, Decreased step length - right, Decreased step length - left, Decreased weight shift to right, Decreased stance time - right Gait velocity: decreased     General Gait Details: Pt took a couple of lateral steps to the right to get higher in bed. Step by step cues  for sequencing. Assist to advance RW. Pt slightly limited RLE WBing d/t pain. Deferred further mobility attempts d/t lethargy.  Stairs             Wheelchair Mobility     Tilt Bed    Modified Rankin (Stroke Patients Only)       Balance Overall balance assessment: Needs assistance Sitting-balance support: Bilateral upper extremity supported, Feet supported Sitting balance-Leahy Scale: Fair Sitting balance - Comments: Pt sat EOB with close supervision.   Standing balance support: Bilateral upper extremity supported, During functional activity, Reliant on assistive device for balance Standing balance-Leahy Scale: Poor Standing balance comment: Pt dependent on RW and external support of therapist.                             Pertinent Vitals/Pain Pain Assessment Pain Assessment: Faces Faces Pain Scale: Hurts little more Pain Location: R Knee Pain Descriptors / Indicators: Operative site guarding, Grimacing, Discomfort Pain Intervention(s): Premedicated before session, Monitored during session, Limited activity within patient's tolerance, Repositioned    Home Living Family/patient expects to be discharged to:: Private residence Living Arrangements: Spouse/significant other Available Help at Discharge: Family;Available 24 hours/day (Husband is retired.) Type of Home: House Home Access: Stairs to enter Entrance Stairs-Rails: Right;Left;Can reach both Secretary/administrator of Steps: 3   Home Layout: One level Home Equipment: Agricultural Consultant (2 wheels);BSC/3in1;Cane - single point;Grab bars - toilet;Grab bars - tub/shower;Shower seat      Prior Function Prior Level of Function : Independent/Modified Independent;Driving             Mobility Comments: Ambulates using SPC. Pt's right knee twisted and popped 11/2 and she almost fell. ADLs Comments: Indep with ADLs and IADLs. Drives. Retired.     Extremity/Trunk Assessment   Upper Extremity Assessment Upper Extremity Assessment: Overall WFL for tasks assessed;Right hand dominant    Lower Extremity Assessment Lower Extremity Assessment: RLE  deficits/detail RLE Deficits / Details: Pt POD 1 s/p TKA. Decreased hip, knee, and ankle AROM. Full PROM on ankle. Pt resisted PROM of knee and hip. She also c/o pain with passive movement. Grossly 2+/5 strength. RLE: Unable to fully assess due to pain RLE Sensation: WNL RLE Coordination: decreased gross motor    Cervical / Trunk Assessment Cervical / Trunk Assessment: Normal  Communication   Communication Communication: No apparent difficulties    Cognition Arousal: Lethargic, Suspect due to medications Behavior During Therapy: WFL for tasks assessed/performed   PT - Cognitive impairments: Difficult to assess Difficult to assess due to: Level of arousal                     PT - Cognition Comments: Pt greeted asleep in bed. Husband reports she is knocked out from the pain medication and usually takes longer to recover from the effects of anesthesia. Pt with eyes closed, able to open when instructed. She benefitted from tactile stimulation. Alertness improved some upon sitting up, but pt overall drowsy. Following commands: Impaired Following commands impaired: Follows one step commands with increased time     Cueing Cueing Techniques: Verbal cues, Gestural cues, Tactile cues     General Comments General comments (skin integrity, edema, etc.): Pt greeted on 2L O2 via [place]. Her SpO2 86-95%. Cued PLB technique.    Exercises Other Exercises Other Exercises: Educated pt/husband on R TKA HEP. Provided handout. Reviewed and demonstrated each exercise with husband verbalizing understanding.   Assessment/Plan  PT Assessment Patient needs continued PT services  PT Problem List Decreased strength;Decreased range of motion;Decreased activity tolerance;Decreased balance;Decreased mobility;Decreased knowledge of use of DME;Decreased safety awareness;Decreased knowledge of precautions;Pain       PT Treatment Interventions DME instruction;Gait training;Stair training;Functional  mobility training;Therapeutic activities;Balance training;Neuromuscular re-education;Therapeutic exercise;Cognitive remediation;Patient/family education    PT Goals (Current goals can be found in the Care Plan section)  Acute Rehab PT Goals Patient Stated Goal: Return Home and regain independence PT Goal Formulation: With patient/family Time For Goal Achievement: 09/07/24 Potential to Achieve Goals: Good    Frequency 7X/week     Co-evaluation               AM-PAC PT 6 Clicks Mobility  Outcome Measure Help needed turning from your back to your side while in a flat bed without using bedrails?: A Lot Help needed moving from lying on your back to sitting on the side of a flat bed without using bedrails?: A Lot Help needed moving to and from a bed to a chair (including a wheelchair)?: A Little Help needed standing up from a chair using your arms (e.g., wheelchair or bedside chair)?: A Little Help needed to walk in hospital room?: A Lot Help needed climbing 3-5 steps with a railing? : A Lot 6 Click Score: 14    End of Session Equipment Utilized During Treatment: Gait belt;Oxygen Activity Tolerance: Patient limited by lethargy Patient left: in bed;with call bell/phone within reach;with bed alarm set;with family/visitor present Nurse Communication: Mobility status PT Visit Diagnosis: Other abnormalities of gait and mobility (R26.89);Unsteadiness on feet (R26.81);Difficulty in walking, not elsewhere classified (R26.2);Pain Pain - Right/Left: Right Pain - part of body: Knee    Time: 8966-8941 PT Time Calculation (min) (ACUTE ONLY): 25 min   Charges:   PT Evaluation $PT Eval Moderate Complexity: 1 Mod   PT General Charges $$ ACUTE PT VISIT: 1 Visit         Randall SAUNDERS, PT, DPT Acute Rehabilitation Services Office: [phone] Secure Chat Preferred  Delon CHRISTELLA Callander 08/24/2024, 11:39 AM

## 2024-08-24 NOTE — Anesthesia Postprocedure Evaluation (Signed)
 Anesthesia Post Note  Patient: Anne Edwards  Procedure(s) Performed: CONVERSION, ARTHROPLASTY, KNEE, PARTIAL, TO TOTAL KNEE ARTHROPLASTY (Right: Knee)     Patient location during evaluation: PACU Anesthesia Type: Spinal and Regional Level of consciousness: awake Pain management: pain level controlled Vital Signs Assessment: post-procedure vital signs reviewed and stable Respiratory status: spontaneous breathing, nonlabored ventilation and respiratory function stable Cardiovascular status: blood pressure returned to baseline and stable Postop Assessment: no apparent nausea or vomiting Anesthetic complications: no   No notable events documented.  Last Vitals:  Vitals:   08/24/24 0715 08/24/24 0735  BP: (!) 144/76 115/73  Pulse: 99 98  Resp:    Temp: 36.9 C   SpO2: 90% 95%    Last Pain:  Vitals:   08/24/24 0428  TempSrc:   PainSc: 6                  Anne Edwards

## 2024-08-25 LAB — CBC
HCT: 33.2 % — ABNORMAL LOW (ref 36.0–46.0)
Hemoglobin: 10.7 g/dL — ABNORMAL LOW (ref 12.0–15.0)
MCH: 28.2 pg (ref 26.0–34.0)
MCHC: 32.2 g/dL (ref 30.0–36.0)
MCV: 87.4 fL (ref 80.0–100.0)
Platelets: 147 K/uL — ABNORMAL LOW (ref 150–400)
RBC: 3.8 MIL/uL — ABNORMAL LOW (ref 3.87–5.11)
RDW: 13.6 % (ref 11.5–15.5)
WBC: 10 K/uL (ref 4.0–10.5)
nRBC: 0 % (ref 0.0–0.2)

## 2024-08-25 MED ORDER — OXYCODONE HCL 5 MG PO TABS: 5.0000 mg | ORAL_TABLET | ORAL | 0 refills | Status: AC | PRN

## 2024-08-25 MED ORDER — CEFADROXIL 500 MG PO CAPS: 500.0000 mg | ORAL_CAPSULE | Freq: Two times a day (BID) | ORAL | 0 refills | Status: AC

## 2024-08-25 MED ORDER — ASPIRIN 81 MG PO TBEC: 81.0000 mg | DELAYED_RELEASE_TABLET | Freq: Two times a day (BID) | ORAL | 0 refills | Status: AC

## 2024-08-25 MED ORDER — SENNA-DOCUSATE SODIUM 8.6-50 MG PO TABS
2.0000 | ORAL_TABLET | Freq: Every day | ORAL | 1 refills | Status: AC
Start: 2024-08-25 — End: ?

## 2024-08-25 MED ORDER — ONDANSETRON HCL 4 MG PO TABS: 4.0000 mg | ORAL_TABLET | Freq: Three times a day (TID) | ORAL | 0 refills | Status: AC | PRN

## 2024-08-25 NOTE — Progress Notes (Signed)
 Physical Therapy Treatment Patient Details Name: Anne Edwards MRN: [id number] DOB: [birth year] Today's Date: 08/25/2024   History of Present Illness Anne Edwards is a 63 y.o. 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 Pt s/p revision right knee arthroplasty converting from unicompartmental knee replacement to a total knee replacement with revision of both the tibia, polyethylene, and femur. PMHx: anxiety, OA, GERD, hiatal hernia, and IDA.    PT Comments  Today's session focused on stair training. Educated pt on how to ascended/descend in accordance with her home set-up. Discussed how her husband should be positioned to support her. Pt recalled up with the good and down with the bad from a prior surgery, so did not require any cues for sequencing. She appropriately led with LLE while going up the stairs and led with RLE when going down the stairs. Pt completed 2 steps using a step-to pattern and BUE support on railings five times. Initially CGA was provided for safety/stability, quickly transitioning to close supervision. Pt practiced a tub transfer to a shower chair. She has limited R knee flexion making it difficult to get in/out of the tub without compensations. Pt required minA to safely complete tub transfer to shower chair. Recommend a tub bench to safely transfer in order to bath a home. Demonstrated proper and safe sequencing using the equipment. Pt's husband reports he will order one for use at home. Pt is making steady progress towards her acute PT goals demonstrated by advanced functional mobility with decreased physical assistance. Pt and Husband feel ready and safe for discharge. I have answered all their questions related to mobility.      If plan is discharge home, recommend the following: A little help with walking and/or transfers;A little help with bathing/dressing/bathroom;Assistance with cooking/housework;Assist for  transportation;Help with stairs or ramp for entrance   Can travel by private vehicle        Equipment Recommendations  None recommended by PT    Recommendations for Other Services       Precautions / Restrictions Precautions Precautions: Knee;Fall Precaution Booklet Issued: Yes (comment) Recall of Precautions/Restrictions: Intact Restrictions Weight Bearing Restrictions Per Provider Order: Yes RLE Weight Bearing Per Provider Order: Weight bearing as tolerated Other Position/Activity Restrictions: Pt will NOT have pillow placed under the bend of the operative total knee.     Mobility  Bed Mobility Overal bed mobility: Needs Assistance Bed Mobility: Supine to Sit     Supine to sit: Supervision     General bed mobility comments: Pt completed supine>sit with HOB slightly elevated and increased time. Pt managed RLE. She scooted to EOB with BUE support.    Transfers Overall transfer level: Needs assistance Equipment used: Rolling walker (2 wheels) Transfers: Sit to/from Stand Sit to Stand: Supervision           General transfer comment: Pt demonstrated proper hand placement using RW. She powered up wtihout physical assist. Increased time to achieve upright posture. Good eccentric control.    Ambulation/Gait Ambulation/Gait assistance: Supervision Gait Distance (Feet): 60 Feet Assistive device: Rolling walker (2 wheels) Gait Pattern/deviations: Step-through pattern, Decreased stride length, Decreased weight shift to right, Decreased stance time - right, Antalgic Gait velocity: decreased Gait velocity interpretation: <1.8 ft/sec, indicate of risk for recurrent falls   General Gait Details: Pt utilizes a step-through gait pattern. Decreased heel strike on R foot, decreased weight acceptance on RLE, and decreased knee flex on RLE. Cues for proximity to RW, pt  tends to allow AD to get slightly too far infront of her. No LOB.   Stairs Stairs: Yes Stairs assistance:  Contact guard assist Stair Management: Two rails, Forwards, Step to pattern Number of Stairs: 2 (x5) General stair comments: Educated pt to ascend with LLE and descend with RLE. Pt reports remembering up with the good and down with the bad from a prior surgery. She took one step at a time and maintained BUE support on rails. Discussed how her husband should be behind her when ascending and in front of her when descending. Pt demonstrated limited R knee flex, slightly circumducting to come up the step.   Wheelchair Mobility     Tilt Bed    Modified Rankin (Stroke Patients Only)       Balance Overall balance assessment: Needs assistance Sitting-balance support: No upper extremity supported, Feet supported Sitting balance-Leahy Scale: Good Sitting balance - Comments: Pt can reach down and touch her feet. She was able to don L sock but required assistance with R sock.   Standing balance support: Bilateral upper extremity supported, During functional activity, Reliant on assistive device for balance Standing balance-Leahy Scale: Poor Standing balance comment: Pt dependent on RW and/or railings.                            Communication Communication Communication: No apparent difficulties  Cognition Arousal: Alert Behavior During Therapy: WFL for tasks assessed/performed   PT - Cognitive impairments: No apparent impairments                         Following commands: Intact      Cueing Cueing Techniques: Verbal cues, Gestural cues  Exercises Total Joint Exercises Goniometric ROM: R Knee Flexion AROM: 55 deg. Other Exercises Other Exercises: Tub Transfer: pt practiced stepping over a tub with HHA where pt's grab bars would be at home. She brought LLE into tub followed by RLE. Pt with limited R knee flex making it difficult to clear tub with the presence of shower chair. She reports her shower chair at home is narrower and her tub is longer/wider. Removed  shower chair in order for pt to clear RLE and returned for pt to sit. She power up from chair with CGA. Stepping out of the tub pt lead with RLE and required minA to increase knee flex in order to clear threshold. Introduced tub bench and demonstrated proper and safe use of equipment. Pt's husband reports he will purchase one later today.    General Comments General comments (skin integrity, edema, etc.): VSS on RA.      Pertinent Vitals/Pain Pain Assessment Pain Assessment: Faces Faces Pain Scale: Hurts little more Pain Location: R Knee with flexion Pain Descriptors / Indicators: Operative site guarding, Grimacing, Discomfort, Aching Pain Intervention(s): Monitored during session, Limited activity within patient's tolerance, Repositioned    Home Living                          Prior Function            PT Goals (current goals can now be found in the care plan section) Acute Rehab PT Goals Patient Stated Goal: Regain independence PT Goal Formulation: With patient/family Time For Goal Achievement: 09/07/24 Potential to Achieve Goals: Good Progress towards PT goals: Progressing toward goals    Frequency    7X/week      PT  Plan      Co-evaluation              AM-PAC PT 6 Clicks Mobility   Outcome Measure  Help needed turning from your back to your side while in a flat bed without using bedrails?: A Little Help needed moving from lying on your back to sitting on the side of a flat bed without using bedrails?: A Little Help needed moving to and from a bed to a chair (including a wheelchair)?: A Little Help needed standing up from a chair using your arms (e.g., wheelchair or bedside chair)?: A Little Help needed to walk in hospital room?: A Little Help needed climbing 3-5 steps with a railing? : A Little 6 Click Score: 18    End of Session Equipment Utilized During Treatment: Gait belt Activity Tolerance: Patient tolerated treatment well Patient  left: in bed;with call bell/phone within reach;with family/visitor present Nurse Communication: Mobility status PT Visit Diagnosis: Other abnormalities of gait and mobility (R26.89);Unsteadiness on feet (R26.81);Difficulty in walking, not elsewhere classified (R26.2);Pain Pain - Right/Left: Right Pain - part of body: Knee     Time: 0921-0945 PT Time Calculation (min) (ACUTE ONLY): 24 min  Charges:    $Gait Training: 23-37 mins PT General Charges $$ ACUTE PT VISIT: 1 Visit                     Randall SAUNDERS, PT, DPT Acute Rehabilitation Services Office: [phone] Secure Chat Preferred  Anne Edwards 08/25/2024, 10:02 AM

## 2024-08-25 NOTE — Plan of Care (Signed)
 Problem: Education: Goal: Knowledge of General Education information will improve Description: Including pain rating scale, medication(s)/side effects and non-pharmacologic comfort measures Outcome: Adequate for Discharge   Problem: Health Behavior/Discharge Planning: Goal: Ability to manage health-related needs will improve Outcome: Adequate for Discharge   Problem: Clinical Measurements: Goal: Ability to maintain clinical measurements within normal limits will improve Outcome: Adequate for Discharge Goal: Will remain free from infection Outcome: Adequate for Discharge Goal: Diagnostic test results will improve Outcome: Adequate for Discharge Goal: Respiratory complications will improve Outcome: Adequate for Discharge Goal: Cardiovascular complication will be avoided Outcome: Adequate for Discharge   Problem: Activity: Goal: Risk for activity intolerance will decrease Outcome: Adequate for Discharge   Problem: Nutrition: Goal: Adequate nutrition will be maintained Outcome: Adequate for Discharge   Problem: Coping: Goal: Level of anxiety will decrease Outcome: Adequate for Discharge   Problem: Elimination: Goal: Will not experience complications related to bowel motility Outcome: Adequate for Discharge Goal: Will not experience complications related to urinary retention Outcome: Adequate for Discharge   Problem: Pain Managment: Goal: General experience of comfort will improve and/or be controlled Outcome: Adequate for Discharge   Problem: Safety: Goal: Ability to remain free from injury will improve Outcome: Adequate for Discharge   Problem: Skin Integrity: Goal: Risk for impaired skin integrity will decrease Outcome: Adequate for Discharge   Problem: Education: Goal: Knowledge of the prescribed therapeutic regimen will improve Outcome: Adequate for Discharge Goal: Individualized Educational Video(s) Outcome: Adequate for Discharge   Problem:  Activity: Goal: Ability to avoid complications of mobility impairment will improve Outcome: Adequate for Discharge Goal: Range of joint motion will improve Outcome: Adequate for Discharge   Problem: Clinical Measurements: Goal: Postoperative complications will be avoided or minimized Outcome: Adequate for Discharge   Problem: Pain Management: Goal: Pain level will decrease with appropriate interventions Outcome: Adequate for Discharge   Problem: Skin Integrity: Goal: Will show signs of wound healing Outcome: Adequate for Discharge   Problem: Education: Goal: Knowledge of General Education information will improve Description: Including pain rating scale, medication(s)/side effects and non-pharmacologic comfort measures Outcome: Adequate for Discharge   Problem: Health Behavior/Discharge Planning: Goal: Ability to manage health-related needs will improve Outcome: Adequate for Discharge   Problem: Clinical Measurements: Goal: Ability to maintain clinical measurements within normal limits will improve Outcome: Adequate for Discharge Goal: Will remain free from infection Outcome: Adequate for Discharge Goal: Diagnostic test results will improve Outcome: Adequate for Discharge Goal: Respiratory complications will improve Outcome: Adequate for Discharge Goal: Cardiovascular complication will be avoided Outcome: Adequate for Discharge   Problem: Activity: Goal: Risk for activity intolerance will decrease Outcome: Adequate for Discharge   Problem: Nutrition: Goal: Adequate nutrition will be maintained Outcome: Adequate for Discharge   Problem: Coping: Goal: Level of anxiety will decrease Outcome: Adequate for Discharge   Problem: Elimination: Goal: Will not experience complications related to bowel motility Outcome: Adequate for Discharge Goal: Will not experience complications related to urinary retention Outcome: Adequate for Discharge   Problem: Pain  Managment: Goal: General experience of comfort will improve and/or be controlled Outcome: Adequate for Discharge   Problem: Safety: Goal: Ability to remain free from injury will improve Outcome: Adequate for Discharge   Problem: Skin Integrity: Goal: Risk for impaired skin integrity will decrease Outcome: Adequate for Discharge   Problem: Education: Goal: Knowledge of the prescribed therapeutic regimen will improve Outcome: Adequate for Discharge Goal: Individualized Educational Video(s) Outcome: Adequate for Discharge   Problem: Activity: Goal: Ability to avoid complications of mobility  impairment will improve Outcome: Adequate for Discharge Goal: Range of joint motion will improve Outcome: Adequate for Discharge   Problem: Clinical Measurements: Goal: Postoperative complications will be avoided or minimized Outcome: Adequate for Discharge   Problem: Pain Management: Goal: Pain level will decrease with appropriate interventions Outcome: Adequate for Discharge   Problem: Skin Integrity: Goal: Will show signs of wound healing Outcome: Adequate for Discharge

## 2024-08-25 NOTE — Plan of Care (Signed)

## 2024-08-25 NOTE — Discharge Summary (Signed)
 Discharge Summary  Patient ID: Anne Edwards MRN: [id number] DOB/AGE: 63/17/1962 63 y.o.  Admit date: 08/23/2024 Discharge date: 08/25/2024  Admission Diagnoses:  S/P revision of total knee, right  Discharge Diagnoses:  Principal Problem:   S/P revision of total knee, right   Past Medical History:  Diagnosis Date   Anxiety    Arthritis    Complication of anesthesia    Slow to wake up   History of hiatal hernia    IDA (iron deficiency anemia) 11/24/2018   Nausea and vomiting 11/26/2020   Pneumonia     Surgeries: Procedure(s): CONVERSION, ARTHROPLASTY, KNEE, PARTIAL, TO TOTAL KNEE ARTHROPLASTY on 08/23/2024   Consultants (if any):   Discharged Condition: Improved  Hospital Course: Anne Edwards is an 63 y.o. female who was admitted 08/23/2024 with a diagnosis of S/P revision of total knee, right and went to the operating room on 08/23/2024 and underwent the above named procedures.    Today's  total administered Morphine Milligram Equivalents: 0 Yesterday's total administered Morphine Milligram Equivalents: 65  She was given perioperative antibiotics:  Anti-infectives (From admission, onward)    Start     Dose/Rate Route Frequency Ordered Stop   08/25/24 0000  cefadroxil  (DURICEF) 500 MG capsule        500 mg Oral 2 times daily 08/25/24 1010     08/23/24 2300  ceFAZolin  (ANCEF ) IVPB 2g/100 mL premix        2 g 200 mL/hr over 30 Minutes Intravenous Every 6 hours 08/23/24 2144 08/24/24 0556   08/23/24 1400  ceFAZolin  (ANCEF ) IVPB 2g/100 mL premix        2 g 200 mL/hr over 30 Minutes Intravenous On call to O.R. 08/23/24 1333 08/23/24 1745   08/23/24 1350  ceFAZolin  (ANCEF ) 2-4 GM/100ML-% IVPB       Note to Pharmacy: Chauncey Rubin L: cabinet override      08/23/24 1350 08/23/24 1748     .  She was given sequential compression devices, early ambulation, and aspirin  for DVT prophylaxis.  She benefited maximally from the hospital stay and there were no  complications.    Recent vital signs:  Vitals:   08/25/24 0411 08/25/24 0815  BP: (!) 127/59 (!) 123/53  Pulse: 64 79  Resp: 18 16  Temp: 97.7 F (36.5 C) 98 F (36.7 C)  SpO2: 99% 98%    Recent laboratory studies:  Lab Results  Component Value Date   HGB 10.7 (L) 08/25/2024   HGB 12.0 08/24/2024   HGB 13.4 08/23/2024   Lab Results  Component Value Date   WBC 10.0 08/25/2024   PLT 147 (L) 08/25/2024   Lab Results  Component Value Date   INR 1.1 12/15/2023   Lab Results  Component Value Date   NA 142 08/24/2024   K 4.2 08/24/2024   CL 107 08/24/2024   CO2 25 08/24/2024   BUN 11 08/24/2024   CREATININE 0.74 08/24/2024   GLUCOSE 108 (H) 08/24/2024    Discharge Medications:   Allergies as of 08/25/2024   No Known Allergies      Medication List     STOP taking these medications    diclofenac 50 MG EC tablet Commonly known as: VOLTAREN   meloxicam 15 MG tablet Commonly known as: MOBIC       TAKE these medications    acetaminophen  500 MG tablet Commonly known as: TYLENOL  Take 500-1,000 mg by mouth every 6 (six) hours as needed (pain.).   aspirin  EC 81  MG tablet Take 1 tablet (81 mg total) by mouth 2 (two) times daily. For DVT Prophylaxis.   buPROPion 150 MG 24 hr tablet Commonly known as: WELLBUTRIN XL Take 150 mg by mouth in the morning.   cefadroxil  500 MG capsule Commonly known as: DURICEF Take 1 capsule (500 mg total) by mouth 2 (two) times daily.   cholecalciferol 25 MCG (1000 UNIT) tablet Commonly known as: VITAMIN D3 Take 1,000 Units by mouth in the morning.   clonazePAM  1 MG tablet Commonly known as: KLONOPIN  Take 1 mg by mouth in the morning and at bedtime.   diphenhydrAMINE  25 MG tablet Commonly known as: BENADRYL  Take 25 mg by mouth in the morning.   escitalopram  20 MG tablet Commonly known as: LEXAPRO  Take 20 mg by mouth in the morning.   gabapentin  300 MG capsule Commonly known as: NEURONTIN  Take 300 mg by mouth at  bedtime.   ondansetron  4 MG tablet Commonly known as: Zofran  Take 1 tablet (4 mg total) by mouth every 8 (eight) hours as needed for nausea or vomiting.   oxyCODONE  5 MG immediate release tablet Commonly known as: Roxicodone  Take 1 tablet (5 mg total) by mouth every 4 (four) hours as needed for severe pain (pain score 7-10).   sennosides-docusate sodium  8.6-50 MG tablet Commonly known as: SENOKOT-S Take 2 tablets by mouth daily.        Diagnostic Studies: DG Knee Right Port Result Date: 08/23/2024 EXAM: 2 VIEW(S) XRAY OF THE RIGHT KNEE 08/23/2024 09:12:00 PM COMPARISON: None available. CLINICAL HISTORY: S/P revision of total knee, right FINDINGS: BONES AND JOINTS: Right knee arthroplasty in satisfactory position. No acute fracture. No joint dislocation. No significant joint effusion. SOFT TISSUES: Associated soft tissue gas. IMPRESSION: 1. Right knee arthroplasty in satisfactory position. Electronically signed by: Pinkie Pebbles MD 08/23/2024 09:15 PM EST RP Workstation: [id number]    Disposition: Discharge disposition: 01-Home or Self Care          Follow-up Information     Josefina Chew, MD. Schedule an appointment as soon as possible for a visit in 2 week(s).   Specialty: Orthopedic Surgery Contact information: [address] [place] [address] [phone]         Jeanette Comer BRAVO, PA-C Follow up.   Specialty: Physician Assistant Contact information: [address] [place] [address] [phone]                  Signed: [employer] MARLA Daring PA-C 08/25/2024, 10:11 AM

## 2024-08-29 LAB — AEROBIC/ANAEROBIC CULTURE W GRAM STAIN (SURGICAL/DEEP WOUND)
Culture: NO GROWTH
Culture: NO GROWTH
Gram Stain: NONE SEEN
Gram Stain: NONE SEEN

## 2024-09-07 DIAGNOSIS — Z96651 Presence of right artificial knee joint: Secondary | ICD-10-CM | POA: Diagnosis not present

## 2024-09-07 DIAGNOSIS — Z471 Aftercare following joint replacement surgery: Secondary | ICD-10-CM | POA: Diagnosis not present

## 2024-09-07 DIAGNOSIS — M25561 Pain in right knee: Secondary | ICD-10-CM | POA: Diagnosis not present
